# Patient Record
Sex: Female | Born: 1945 | Race: White | Hispanic: No | Smoking: Never smoker
Health system: Southern US, Community
[De-identification: ages and names within clinical notes are randomized; demographics above are authoritative.]

## PROBLEM LIST (undated history)

## (undated) DIAGNOSIS — F329 Major depressive disorder, single episode, unspecified: Secondary | ICD-10-CM

## (undated) DIAGNOSIS — E7800 Pure hypercholesterolemia, unspecified: Secondary | ICD-10-CM

## (undated) DIAGNOSIS — F32A Depression, unspecified: Secondary | ICD-10-CM

## (undated) DIAGNOSIS — F0390 Unspecified dementia without behavioral disturbance: Secondary | ICD-10-CM

## (undated) DIAGNOSIS — E079 Disorder of thyroid, unspecified: Secondary | ICD-10-CM

## (undated) HISTORY — PX: BREAST ENHANCEMENT SURGERY: SHX7

## (undated) HISTORY — PX: ABDOMINAL HYSTERECTOMY: SHX81

---

## 2013-09-21 ENCOUNTER — Emergency Department (HOSPITAL_COMMUNITY): Payer: Medicare Other

## 2013-09-21 ENCOUNTER — Emergency Department (HOSPITAL_COMMUNITY)
Admission: EM | Admit: 2013-09-21 | Discharge: 2013-09-21 | Disposition: A | Discharge status: Home / Self Care | Payer: Medicare Other | Attending: Emergency Medicine | Admitting: Emergency Medicine

## 2013-09-21 ENCOUNTER — Encounter (HOSPITAL_COMMUNITY): Payer: Self-pay | Admitting: Emergency Medicine

## 2013-09-21 DIAGNOSIS — S8002XA Contusion of left knee, initial encounter: Secondary | ICD-10-CM

## 2013-09-21 DIAGNOSIS — Y9289 Other specified places as the place of occurrence of the external cause: Secondary | ICD-10-CM | POA: Insufficient documentation

## 2013-09-21 DIAGNOSIS — Z79899 Other long term (current) drug therapy: Secondary | ICD-10-CM | POA: Insufficient documentation

## 2013-09-21 DIAGNOSIS — Y9389 Activity, other specified: Secondary | ICD-10-CM | POA: Insufficient documentation

## 2013-09-21 DIAGNOSIS — S8000XA Contusion of unspecified knee, initial encounter: Secondary | ICD-10-CM | POA: Insufficient documentation

## 2013-09-21 DIAGNOSIS — E669 Obesity, unspecified: Secondary | ICD-10-CM | POA: Insufficient documentation

## 2013-09-21 DIAGNOSIS — M7989 Other specified soft tissue disorders: Secondary | ICD-10-CM

## 2013-09-21 DIAGNOSIS — F3289 Other specified depressive episodes: Secondary | ICD-10-CM | POA: Insufficient documentation

## 2013-09-21 DIAGNOSIS — R296 Repeated falls: Secondary | ICD-10-CM | POA: Insufficient documentation

## 2013-09-21 DIAGNOSIS — E7800 Pure hypercholesterolemia, unspecified: Secondary | ICD-10-CM | POA: Insufficient documentation

## 2013-09-21 DIAGNOSIS — W19XXXA Unspecified fall, initial encounter: Secondary | ICD-10-CM

## 2013-09-21 DIAGNOSIS — F329 Major depressive disorder, single episode, unspecified: Secondary | ICD-10-CM | POA: Insufficient documentation

## 2013-09-21 DIAGNOSIS — E079 Disorder of thyroid, unspecified: Secondary | ICD-10-CM | POA: Insufficient documentation

## 2013-09-21 HISTORY — DX: Depression, unspecified: F32.A

## 2013-09-21 HISTORY — DX: Pure hypercholesterolemia, unspecified: E78.00

## 2013-09-21 HISTORY — DX: Major depressive disorder, single episode, unspecified: F32.9

## 2013-09-21 HISTORY — DX: Disorder of thyroid, unspecified: E07.9

## 2013-09-21 MED ORDER — NAPROXEN 500 MG PO TABS: 500.0000 mg | ORAL_TABLET | Freq: Two times a day (BID) | ORAL | Status: DC

## 2013-09-21 NOTE — ED Notes (Signed)
Pt brought into ED by [facility] Staff. [facility] was bringing pt's husband down to ED from [facility]. Pt was walking with a [facility] staff member when her leg 'gave' out. Pt states the same thing happened Sunday. Denies dizziness. Pt states she hasn't eaten yet today. MAE x4 freely.

## 2013-09-21 NOTE — Progress Notes (Signed)
*  Preliminary Results* Left lower extremity venous duplex completed. Left lower extremity is negative for deep vein thrombosis. There is evidence of a 5cm left Baker's cyst.  09/21/2013 3:29 PM  Gertie Fey, RVT, RDCS, RDMS

## 2013-09-21 NOTE — ED Notes (Signed)
Pt remains in vascular.

## 2013-09-21 NOTE — ED Provider Notes (Signed)
CSN: [id number]     Arrival date & time 09/21/13  1207 History   First MD Initiated Contact with Patient 09/21/13 1236     Chief Complaint  Patient presents with  . Fall   (Consider location/radiation/quality/duration/timing/severity/associated sxs/prior Treatment) HPI Comments: 67 year old female who has been undergoing long travels back and forth by 12 hour car ride frequently several times a month, presents after falling in the parking lot. States that her left became bruised when she fell from standing. She denies loss of consciousness, no prodromal symptoms, denies feeling like her leg gave out but cannot explain the fall in any other manner. There have been no other weakness, numbness, headache, blurred vision or any other complaints at all. This did happen the patient once Previously.  This seems to cluster around her travels, feels like her leg is tight and aches after traveling. No history of DVT though she does have family members with it.  Patient is a 67 y.o. female presenting with fall. The history is provided by the patient.  Fall    Past Medical History  Diagnosis Date  . Depression   . Hypercholesteremia   . Thyroid disease    Past Surgical History  Procedure Laterality Date  . Breast enhancement surgery    . Abdominal hysterectomy     No family history on file. History  Substance Use Topics  . Smoking status: Never Smoker   . Smokeless tobacco: Not on file  . Alcohol Use: Yes   OB History   Grav Para Term Preterm Abortions TAB SAB Ect Mult Living                 Review of Systems  All other systems reviewed and are negative.    Allergies  Codeine  Home Medications   Current Outpatient Rx  Name  Route  Sig  Dispense  Refill  . atorvastatin (LIPITOR) 20 MG tablet   Oral   Take 20 mg by mouth every other day.         . b complex vitamins tablet   Oral   Take 1 tablet by mouth daily.         . calcium-vitamin D (OSCAL WITH D) 500-200 MG-UNIT  per tablet   Oral   Take 1 tablet by mouth 2 (two) times daily.         . fish oil-omega-3 fatty acids 1000 MG capsule   Oral   Take 1 g by mouth 2 (two) times daily.         Marland Kitchen FLUoxetine (PROZAC) 20 MG capsule   Oral   Take 20 mg by mouth daily.         Marland Kitchen levothyroxine (SYNTHROID, LEVOTHROID) 112 MCG tablet   Oral   Take 112 mcg by mouth daily before breakfast.         . Melatonin 5 MG TABS   Oral   Take 5 mg by mouth at bedtime.         . Multiple Vitamin (MULTIVITAMIN WITH MINERALS) TABS tablet   Oral   Take 1 tablet by mouth daily.         . naproxen (NAPROSYN) 500 MG tablet   Oral   Take 1 tablet (500 mg total) by mouth 2 (two) times daily with a meal.   30 tablet   0    BP 104/88  Pulse 67  Temp(Src) 98.2 F (36.8 C)  Resp 15  Ht 5\' 4"  (1.626 m)  Wt  196 lb (88.905 kg)  BMI 33.63 kg/m2  SpO2 99% Physical Exam  Nursing note and vitals reviewed. Constitutional: She appears well-developed and well-nourished. No distress.  HENT:  Head: Normocephalic and atraumatic.  Mouth/Throat: Oropharynx is clear and moist. No oropharyngeal exudate.  Eyes: Conjunctivae and EOM are normal. Pupils are equal, round, and reactive to light. Right eye exhibits no discharge. Left eye exhibits no discharge. No scleral icterus.  Neck: Normal range of motion. Neck supple. No JVD present. No thyromegaly present.  Cardiovascular: Normal rate, regular rhythm, normal heart sounds and intact distal pulses.  Exam reveals no gallop and no friction rub.   No murmur heard. Pulmonary/Chest: Effort normal and breath sounds normal. No respiratory distress. She has no wheezes. She has no rales.  Abdominal: Soft. Bowel sounds are normal. She exhibits no distension and no mass. There is no tenderness.  Musculoskeletal: Normal range of motion. She exhibits tenderness ( Moderate tenderness and bruising to the left knee just distal to the patella, normal range of motion of the knee with  minimal difficulty). She exhibits no edema.  Lymphadenopathy:    She has no cervical adenopathy.  Neurological: She is alert. Coordination normal.  Skin: Skin is warm and dry. No rash noted. No erythema.  Psychiatric: She has a normal mood and affect. Her behavior is normal.    ED Course  Procedures (including critical care time) Labs Review Labs Reviewed - No data to display Imaging Review Dg Knee Complete 4 Views Left  09/21/2013   CLINICAL DATA:  Fall.  EXAM: LEFT KNEE - COMPLETE 4+ VIEW  COMPARISON:  None.  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 Small joint effusion. No acute bony abnormality. Specifically, no fracture, subluxation, or dislocation. Soft tissues are intact.  IMPRESSION: No acute bony abnormality.   Electronically Signed   By: Charlett Nose M.D.   On: 09/21/2013 13:38    EKG Interpretation     Ventricular Rate:  65 PR Interval:  132 QRS Duration: 96 QT Interval:  454 QTC Calculation: 472 R Axis:   -11 Text Interpretation:  Age not entered, assumed to be  67 years old for purpose of ECG interpretation Sinus rhythm Low voltage, precordial leads Borderline repolarization abnormality No old tracing to compare            MDM   1. Contusion of left knee, initial encounter   2. Fall, initial encounter    The patient is obese overweight legs, and is difficult to tell if there is asymmetry, she has been traveling and has left lower extremity symptoms with recurrent falls related to her left leg. Due to the bruising she wanted x-rays to rule out fracture, we'll also obtain an ultrasound to rule out DVT of the left lower extremity. She is otherwise hemodynamically stable and has a normal EKG.  [place] n eg for DVT, xray neg for frx, stable for d/c.   RICE therapy -   Meds given in ED:  Medications - No data to display  New Prescriptions   NAPROXEN  (NAPROSYN) 500 MG TABLET    Take 1 tablet (500 mg total) by mouth 2 (two) times daily with a meal.      Vida Roller, MD 09/21/13 1550

## 2019-04-21 ENCOUNTER — Ambulatory Visit (INDEPENDENT_AMBULATORY_CARE_PROVIDER_SITE_OTHER): Payer: Medicare Other | Admitting: Internal Medicine

## 2019-04-21 ENCOUNTER — Other Ambulatory Visit: Payer: Self-pay

## 2019-04-21 ENCOUNTER — Encounter: Payer: Self-pay | Admitting: Internal Medicine

## 2019-04-21 DIAGNOSIS — G4700 Insomnia, unspecified: Secondary | ICD-10-CM | POA: Diagnosis not present

## 2019-04-21 DIAGNOSIS — E039 Hypothyroidism, unspecified: Secondary | ICD-10-CM | POA: Diagnosis not present

## 2019-04-21 DIAGNOSIS — E785 Hyperlipidemia, unspecified: Secondary | ICD-10-CM | POA: Diagnosis not present

## 2019-04-21 DIAGNOSIS — F339 Major depressive disorder, recurrent, unspecified: Secondary | ICD-10-CM

## 2019-04-21 MED ORDER — ZOLPIDEM TARTRATE 10 MG PO TABS: 5.0000 mg | ORAL_TABLET | Freq: Every evening | ORAL | 2 refills | Status: DC | PRN

## 2019-04-21 MED ORDER — LORAZEPAM 2 MG PO TABS: 1.0000 mg | ORAL_TABLET | Freq: Every evening | ORAL | 2 refills | Status: DC | PRN

## 2019-04-21 NOTE — Patient Instructions (Signed)
-  Nice meeting you today!  -Schedule follow up with me in September for your wellness visit, please come in fasting.

## 2019-04-21 NOTE — Progress Notes (Signed)
New Patient Office Visit     CC/Reason for Visit: Establish care, follow-up on chronic medical conditions Previous PCP: In [place] Last Visit: September 2019  HPI: Brooke GullyBarbara Owens is a 73 y.o. female who is coming in today for the above mentioned reasons. Past Medical History is significant for: Hypothyroidism, hyperlipidemia, depression and insomnia.  In regards to her depression she has been on Prozac for years does well with this and feels like mood is stable.  She takes Lipitor for hyperlipidemia and Synthroid for hypothyroidism.  She was told her levels "were okay" when she had her wellness visit in September in [place].  She has significant issues with insomnia for which she takes Ambien and Ativan.  She takes a half a tablet of each but does not take them together, she alternates them every other night.  Sometimes despite that she also uses Benadryl for sleeping purposes.  She has been on this regimen for over 10 years.  She has not had any falls in the last 6 months, she does not have confusion or somnolence the next morning.  She is accompanied by her daughter came today who helps with providing history.  [place] in [place] and has come here to stay with her daughter now.   Past Medical/Surgical History: Past Medical History:  Diagnosis Date  . Depression   . Hypercholesteremia   . Thyroid disease     Past Surgical History:  Procedure Laterality Date  . ABDOMINAL HYSTERECTOMY    . BREAST ENHANCEMENT SURGERY      Social History:  reports that she has never smoked. She has never used smokeless tobacco. She reports current alcohol use. She reports that she does not use drugs.  Allergies: Allergies  Allergen Reactions  . Codeine     Family History:  No history of cancer, stroke that patient is aware of  Current Outpatient Medications:  .  atorvastatin (LIPITOR) 20 MG tablet, Take 20 mg by mouth every other day., Disp: , Rfl:  .   diphenhydrAMINE (BENADRYL) 25 MG tablet, Take 25 mg by mouth at bedtime as needed., Disp: , Rfl:  .  FLUoxetine (PROZAC) 20 MG capsule, Take 20 mg by mouth daily., Disp: , Rfl:  .  levothyroxine (SYNTHROID, LEVOTHROID) 112 MCG tablet, Take 112 mcg by mouth daily before breakfast., Disp: , Rfl:  .  LORazepam (ATIVAN) 2 MG tablet, Take 0.5 tablets (1 mg total) by mouth at bedtime as needed for sleep., Disp: 20 tablet, Rfl: 2 .  Multiple Vitamin (MULTIVITAMIN WITH MINERALS) TABS tablet, Take 1 tablet by mouth daily., Disp: , Rfl:  .  naproxen (NAPROSYN) 500 MG tablet, Take 1 tablet (500 mg total) by mouth 2 (two) times daily with a meal., Disp: 30 tablet, Rfl: 0 .  zolpidem (AMBIEN) 10 MG tablet, Take 0.5 tablets (5 mg total) by mouth at bedtime as needed for sleep., Disp: 20 tablet, Rfl: 2  Review of Systems:  Constitutional: Denies fever, chills, diaphoresis, appetite change and fatigue.  HEENT: Denies photophobia, eye pain, redness, hearing loss, ear pain, congestion, sore throat, rhinorrhea, sneezing, mouth sores, trouble swallowing, neck pain, neck stiffness and tinnitus.   Respiratory: Denies SOB, DOE, cough, chest tightness,  and wheezing.   Cardiovascular: Denies chest pain, palpitations and leg swelling.  Gastrointestinal: Denies nausea, vomiting, abdominal pain, diarrhea, constipation, blood in stool and abdominal distention.  Genitourinary: Denies dysuria, urgency, frequency, hematuria, flank pain and difficulty urinating.  Endocrine: Denies: hot or cold  intolerance, sweats, changes in hair or nails, polyuria, polydipsia. Musculoskeletal: Denies myalgias, back pain, joint swelling, arthralgias and gait problem.  Skin: Denies pallor, rash and wound.  Neurological: Denies dizziness, seizures, syncope, weakness, light-headedness, numbness and headaches.  Hematological: Denies adenopathy. Easy bruising, personal or family bleeding history  Psychiatric/Behavioral: Denies suicidal ideation,  mood changes, confusion, nervousness, sleep disturbance and agitation    Physical Exam: Vitals:   04/21/19 1308  BP: 140/80  Pulse: 68  Temp: 98.1 F (36.7 C)  TempSrc: Oral  SpO2: 96%  Weight: 171 lb 6.4 oz (77.7 kg)  Height: 5\' 4"  (1.626 m)   Body mass index is 29.42 kg/m.   Constitutional: NAD, calm, comfortable Eyes: PERRL, lids and conjunctivae normal ENMT: Mucous membranes are moist.  Respiratory: clear to auscultation bilaterally, no wheezing, no crackles. Normal respiratory effort. No accessory muscle use.  Cardiovascular: Regular rate and rhythm, no murmurs / rubs / gallops. No extremity edema. 2+ pedal pulses. No carotid bruits.  Abdomen: no tenderness, no masses palpated. No hepatosplenomegaly. Bowel sounds positive.  Musculoskeletal: no clubbing / cyanosis. No joint deformity upper and lower extremities. Good ROM, no contractures. Normal muscle tone.  Psychiatric: Normal judgment and insight. Alert and oriented x 3. Normal mood.    Impression and Plan:  Hypothyroidism, unspecified type -Continue Synthroid, check TSH during physical in September.  Hyperlipidemia, unspecified hyperlipidemia type -Continue Lipitor, check lipids at time of wellness visit.  Insomnia, unspecified type  -Since she has been on Ativan and Ambien long-term, have agreed to continue these prescriptions with the understanding that patient's daughter is to manage her medications and that she is never to take Ativan and Ambien on the same night.  Depression, recurrent (HCC) -Mood is stable, continue Prozac.     Patient Instructions  -Nice meeting you today!  -Schedule follow up with me in September for your wellness visit, please come in fasting.     Chaya Jan, MD  Primary Care at [facility]

## 2019-07-14 ENCOUNTER — Other Ambulatory Visit: Payer: Self-pay | Admitting: Internal Medicine

## 2019-07-14 MED ORDER — ATORVASTATIN CALCIUM 20 MG PO TABS: 20.0000 mg | ORAL_TABLET | ORAL | 0 refills | Status: AC

## 2019-07-14 NOTE — Telephone Encounter (Signed)
Patient last seen 04/21/2019. Medication was last filled 09/21/2013 by historical provider.  Requested Prescriptions  Pending Prescriptions Disp Refills  . atorvastatin (LIPITOR) 20 MG tablet      Sig: Take 1 tablet (20 mg total) by mouth every other day.     Cardiovascular:  Antilipid - Statins Failed - 07/14/2019 10:24 AM      Failed - Total Cholesterol in normal range and within 360 days    No results found for: CHOL, POCCHOL       Failed - LDL in normal range and within 360 days    No results found for: LDLCALC, LDLC, HIRISKLDL       Failed - HDL in normal range and within 360 days    No results found for: HDL       Failed - Triglycerides in normal range and within 360 days    No results found for: TRIG       Passed - Patient is not pregnant      Passed - Valid encounter within last 12 months    Recent Outpatient Visits          2 months ago Hypothyroidism, unspecified type   Therapist, music at [employer], Rayford Halsted, MD

## 2019-07-14 NOTE — Telephone Encounter (Signed)
Medication Refill - Medication: atorvastatin (LIPITOR) 20 MG tablet    Has the patient contacted their pharmacy? Yes.   Pts daughter called stating pt is out of medication. Please advise.  (Agent: If no, request that the patient contact the pharmacy for the refill.) (Agent: If yes, when and what did the pharmacy advise?)  Preferred Pharmacy (with phone number or street name):  [facility] [address], [place]  [place] [address]  Phone: [phone] Fax: [phone]  Not a 24 hour pharmacy; exact hours not known.    Agent: Please be advised that RX refills may take up to 3 business days. We ask that you follow-up with your pharmacy.

## 2019-08-30 ENCOUNTER — Other Ambulatory Visit: Payer: Self-pay

## 2019-08-30 ENCOUNTER — Telehealth (INDEPENDENT_AMBULATORY_CARE_PROVIDER_SITE_OTHER): Payer: Medicare Other | Admitting: Neurology

## 2019-08-30 ENCOUNTER — Encounter: Payer: Self-pay | Admitting: Neurology

## 2019-08-30 VITALS — Ht 64.0 in | Wt 176.0 lb

## 2019-08-30 DIAGNOSIS — F03A0 Unspecified dementia, mild, without behavioral disturbance, psychotic disturbance, mood disturbance, and anxiety: Secondary | ICD-10-CM

## 2019-08-30 DIAGNOSIS — F0390 Unspecified dementia without behavioral disturbance: Secondary | ICD-10-CM

## 2019-08-30 MED ORDER — DONEPEZIL HCL 10 MG PO TABS: ORAL_TABLET | ORAL | 11 refills | Status: DC

## 2019-08-30 NOTE — Progress Notes (Signed)
Virtual Visit via Video Note The purpose of this virtual visit is to provide medical care while limiting exposure to the novel coronavirus.    Consent was obtained for video visit:  Yes.   Answered questions that patient had about telehealth interaction:  Yes.   I discussed the limitations, risks, security and privacy concerns of performing an evaluation and management service by telemedicine. I also discussed with the patient that there may be a patient responsible charge related to this service. The patient expressed understanding and agreed to proceed.  Pt location: Home Physician Location: office Name of referring provider:  Glenis Smoker, * I connected with Radene Ou at patients initiation/request on 08/30/2019 at  1:00 PM EDT by video enabled telemedicine application and verified that I am speaking with the correct person using two identifiers. Pt MRN:  [id number] Pt DOB:  1946/09/21 Video Participants:  Radene Ou;  Dortha Kern (daughter)   History of Present Illness:  The patient was seen as a virtual video visit on 08/30/2019. She is a 73 year old right-handed woman with a history of hyperlipidemia, hypothyroidism, depression, presenting for evaluation of memory loss. She feels her memory is going pretty fast lately. She had been living in [place] and moved to [facility] last January. Her daughter reports that when the patient's mother passed away in 08/22/18, she noticed some memory changes but attributed it to grieving. She stayed with Kim's family in November and noticed that she was more forgetful, asking the same questions. Kim asked her to move to [facility], she lives alone very close to [employer]. When she was living in [place], she was managing her finances without issues. Maudie Mercury reports that when her husband passed away in 03-22-2016, majority of bills were on autodraft. Maudie Mercury states she has no concept of bills and bank accounts and does not think she could do it, there were 6 different  bank accounts which she did close before moving, and when she moved she had written checks from accounts she had already closed. She was not balancing her checkbook. She states she got lost driving one day due to road work, she has not been driving in [place]. She denies misplacing things frequently or leaving the stove on. She denies any word-finding difficulties. Mood is fine. Maudie Mercury has not noticed any personality changes, no paranoia or hallucinations. In March, she had been taking Ambien, Ativan, and Benadryl, they had stopped Ativan in July and Benadryl a couple of weeks ago. She manages her own medications and occasionally forgets it, but for the most part does well. Maudie Mercury is not sure if there has been any improvement in cognition off the Benadryl and Ativan. Sleep is fine, she wakes up but goes back to sleep. Her father and paternal grandmother had Alzheimer's disease. No history of significant head injuries. She used to drink alcohol but stopped many years ago.   She denies any headaches, dizziness, diplopia, dysarthria, dysphagia, neck/back pain, focal numbness/tingling/weakness, bladder dysfunction. No tremors, no falls. She has occasional constipation. She is a retired Theme park manager and had lost her sense of smell at age 28.   Laboratory Data: TSH 08/16/2019: 0.06   PAST MEDICAL HISTORY: Past Medical History:  Diagnosis Date   Depression    Hypercholesteremia    Thyroid disease     PAST SURGICAL HISTORY: Past Surgical History:  Procedure Laterality Date   ABDOMINAL HYSTERECTOMY     BREAST ENHANCEMENT SURGERY      MEDICATIONS: Current Outpatient Medications on File Prior  to Visit  Medication Sig Dispense Refill   atorvastatin (LIPITOR) 20 MG tablet Take 1 tablet (20 mg total) by mouth every other day. (Patient taking differently: Take 20 mg by mouth daily. ) 90 tablet 0   FLUoxetine (PROZAC) 20 MG capsule Take 20 mg by mouth daily.     levothyroxine (SYNTHROID) 100 MCG tablet Take  100 mcg by mouth daily before breakfast.     LORazepam (ATIVAN) 2 MG tablet Take 0.5 tablets (1 mg total) by mouth at bedtime as needed for sleep. 20 tablet 2   Multiple Vitamin (MULTIVITAMIN WITH MINERALS) TABS tablet Take 1 tablet by mouth daily.     naproxen (NAPROSYN) 500 MG tablet Take 1 tablet (500 mg total) by mouth 2 (two) times daily with a meal. (Patient taking differently: Take 500 mg by mouth as needed. ) 30 tablet 0   zolpidem (AMBIEN) 10 MG tablet Take 0.5 tablets (5 mg total) by mouth at bedtime as needed for sleep. 20 tablet 2   No current facility-administered medications on file prior to visit.     ALLERGIES: Allergies  Allergen Reactions   Codeine     FAMILY HISTORY: History reviewed. Father and paternal grandmother had Alzheimer's disease.  Observations/Objective:   Vitals:   08/30/19 1048  Weight: 176 lb (79.8 kg)  Height: 5\' 4"  (1.626 m)   GEN:  The patient appears stated age and is in NAD.  Neurological examination: Patient is awake, alert, oriented x 3. No aphasia or dysarthria. Intact fluency and comprehension. Remote and recent memory impaired. Able to name and repeat.  Montreal Cognitive Assessment  08/30/2019  Visuospatial/ Executive (0/5) 2  Naming (0/3) 2  Attention: Read list of digits (0/2) 2  Attention: Read list of letters (0/1) 1  Attention: Serial 7 subtraction starting at 100 (0/3) 0  Language: Repeat phrase (0/2) 2  Language : Fluency (0/1) 1  Abstraction (0/2) 0  Delayed Recall (0/5) 0  Orientation (0/6) 4  Total 14  Adjusted Score (based on education) 15   Cranial nerves: Extraocular movements intact with no nystagmus. No facial asymmetry. Motor: moves all extremities symmetrically, at least anti-gravity x 4. No incoordination on finger to nose testing. Gait: narrow-based and steady, able to tandem walk adequately. Negative Romberg test.  Assessment and Plan:   This is a  73 year old right-handed woman with a history of  hyperlipidemia, hypothyroidism, depression, presenting for evaluation of memory loss. Her neurological exam (although limited on video) is non-focal, MOCA score 15/30. She is also having slightly more difficulties with complex tasks. 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 MRI brain without contrast will be ordered to assess for underlying structural abnormality and assess vascular load. Check B12 level, her TSH was abnormal, medication has been adjusted. We discussed starting Donepezil, including side effects and expectations, start 10mg  1/2 tablet daily for 2 weeks, then increase to 1 tablet daily. Continue close supervision. She has not been driving. At this point with current evaluation, decision-making capacity is intact,she will be appointing her daughter as POA. Follow-up in 6 months, they know to call for any changes.    Follow Up Instructions:   -I discussed the assessment and treatment plan with the patient/daughter. The patient/daughter were provided an opportunity to ask questions and all were answered. The patient/daughter agreed with the plan and demonstrated an understanding of the instructions.   The patient/daughter were advised to call back or seek an in-person evaluation if the  symptoms worsen or if the condition fails to improve as anticipated.    Van ClinesKaren M Sokha Craker, MD

## 2019-09-26 ENCOUNTER — Other Ambulatory Visit: Payer: Self-pay

## 2019-09-26 ENCOUNTER — Ambulatory Visit
Admission: RE | Admit: 2019-09-26 | Discharge: 2019-09-26 | Disposition: A | Discharge status: Home / Self Care | Payer: Medicare Other | Source: Ambulatory Visit | Attending: Neurology | Admitting: Neurology

## 2019-09-26 DIAGNOSIS — F03A0 Unspecified dementia, mild, without behavioral disturbance, psychotic disturbance, mood disturbance, and anxiety: Secondary | ICD-10-CM

## 2019-09-26 DIAGNOSIS — F0390 Unspecified dementia without behavioral disturbance: Secondary | ICD-10-CM

## 2019-09-29 ENCOUNTER — Telehealth: Payer: Self-pay

## 2019-09-29 NOTE — Telephone Encounter (Signed)
-----   Message from Cameron Sprang, MD sent at 09/28/2019 12:15 PM EST ----- Pls let patient/daughter know I reviewed MRI brain, no evidence of tumor, stroke, or bleed. It shows age-related changes. Thanks

## 2019-09-29 NOTE — Telephone Encounter (Signed)
Pt informed of MRI results. No concerns at this time. 

## 2019-10-05 ENCOUNTER — Telehealth: Payer: Self-pay | Admitting: Neurology

## 2019-10-05 NOTE — Telephone Encounter (Signed)
Patient's daughter, Geanie Kenning (no DPR on file to share PHI), called and left a message with Access Nurse today at 12:25 PM.   Caller states she is trying to get a letter saying that her mother has dementia but also wondering how she can get her competency tested.

## 2019-10-05 NOTE — Telephone Encounter (Signed)
Dr. Delice Lesch,  Would you please type a letter for this pts daughter. It will be to appoint her as POA. Daughter is Brooke Owens. Letter can be mailed to pts home address.

## 2019-10-10 ENCOUNTER — Encounter: Payer: Self-pay | Admitting: Neurology

## 2019-10-10 NOTE — Telephone Encounter (Signed)
Please let daughter know note is ready. When we did the visit last month, her mother did not appear to have any competency issues, however if she would like a formal competency evaluation, I have been sending my patients to Dr. Dillard Essex for this, she can contact Dr. Marylynn Pearson office number [phone] for Assessment of Decision-making capacity. Thanks

## 2019-10-10 NOTE — Telephone Encounter (Signed)
Letter addressed to daughter, Dortha Kern. Address [address]                  [place] [address] Mailed today.

## 2020-01-11 ENCOUNTER — Telehealth: Payer: Self-pay | Admitting: Neurology

## 2020-01-11 NOTE — Telephone Encounter (Signed)
Ok to stop donepezil, no need to wean. Thanks

## 2020-01-11 NOTE — Telephone Encounter (Signed)
Patient called regarding her mom Brooke Owens and her Donepezil 10 mg medication. The patient is wanting to stop taking her medication due to it upsetting her stomach and she feels that it is not helping her. Her daughter [name] would like to know if she should wean off or can she just stop the medication? Please Call. Thank you

## 2020-01-11 NOTE — Telephone Encounter (Signed)
Pt daughter called informed Ok to stop donepezil, no need to wean

## 2020-01-27 ENCOUNTER — Other Ambulatory Visit: Payer: Self-pay | Admitting: Family Medicine

## 2020-01-27 DIAGNOSIS — Z1231 Encounter for screening mammogram for malignant neoplasm of breast: Secondary | ICD-10-CM

## 2020-01-27 DIAGNOSIS — E2839 Other primary ovarian failure: Secondary | ICD-10-CM

## 2020-04-09 ENCOUNTER — Ambulatory Visit: Payer: Medicare Other | Admitting: Neurology

## 2020-05-02 ENCOUNTER — Other Ambulatory Visit: Payer: Medicare Other

## 2020-05-02 ENCOUNTER — Ambulatory Visit: Payer: Medicare Other

## 2020-06-20 ENCOUNTER — Ambulatory Visit
Admission: RE | Admit: 2020-06-20 | Discharge: 2020-06-20 | Disposition: A | Discharge status: Home / Self Care | Payer: Medicare Other | Source: Ambulatory Visit | Attending: Family Medicine | Admitting: Family Medicine

## 2020-06-20 DIAGNOSIS — E2839 Other primary ovarian failure: Secondary | ICD-10-CM

## 2022-02-14 ENCOUNTER — Other Ambulatory Visit: Payer: Self-pay

## 2022-02-14 ENCOUNTER — Emergency Department (HOSPITAL_COMMUNITY): Payer: Medicare Other

## 2022-02-14 ENCOUNTER — Encounter (HOSPITAL_COMMUNITY): Payer: Self-pay

## 2022-02-14 ENCOUNTER — Emergency Department (HOSPITAL_COMMUNITY)
Admission: EM | Admit: 2022-02-14 | Discharge: 2022-02-14 | Disposition: A | Discharge status: Home / Self Care | Payer: Medicare Other | Attending: Emergency Medicine | Admitting: Emergency Medicine

## 2022-02-14 DIAGNOSIS — S60222A Contusion of left hand, initial encounter: Secondary | ICD-10-CM | POA: Diagnosis not present

## 2022-02-14 DIAGNOSIS — F0390 Unspecified dementia without behavioral disturbance: Secondary | ICD-10-CM | POA: Insufficient documentation

## 2022-02-14 DIAGNOSIS — S60512A Abrasion of left hand, initial encounter: Secondary | ICD-10-CM | POA: Diagnosis not present

## 2022-02-14 DIAGNOSIS — S0993XA Unspecified injury of face, initial encounter: Secondary | ICD-10-CM | POA: Diagnosis present

## 2022-02-14 DIAGNOSIS — S00411A Abrasion of right ear, initial encounter: Secondary | ICD-10-CM | POA: Diagnosis not present

## 2022-02-14 DIAGNOSIS — W01198A Fall on same level from slipping, tripping and stumbling with subsequent striking against other object, initial encounter: Secondary | ICD-10-CM | POA: Insufficient documentation

## 2022-02-14 DIAGNOSIS — Z79899 Other long term (current) drug therapy: Secondary | ICD-10-CM | POA: Insufficient documentation

## 2022-02-14 DIAGNOSIS — W06XXXA Fall from bed, initial encounter: Secondary | ICD-10-CM

## 2022-02-14 DIAGNOSIS — S0083XA Contusion of other part of head, initial encounter: Secondary | ICD-10-CM | POA: Diagnosis not present

## 2022-02-14 HISTORY — DX: Unspecified dementia, unspecified severity, without behavioral disturbance, psychotic disturbance, mood disturbance, and anxiety: F03.90

## 2022-02-14 NOTE — ED Triage Notes (Signed)
Patient BIB [facility] from [facility] memory care unit. Had a fall out of bed 30 minutes ago. Staff member saw her roll out of bed and hit her head on night stand. Knot on the right side of her cheek. Laceration to her right ear, left hand. Dementia. No blood thinners no LOC.  ?

## 2022-02-14 NOTE — ED Provider Notes (Signed)
? ?[facility]-EMERGENCY DEPT ?Provider Note: Lowella Dell, MD, FACEP ? ?CSN: [id number] ?MRN: [id number] ?ARRIVAL: 02/14/22 at 0038 ?ROOM: [facility]15/[facility]15 ? ? ?CHIEF COMPLAINT  ?Fall ? ?Level 5 caveat: Dementia ?HISTORY OF PRESENT ILLNESS  ?02/14/22 1:19 AM ?Brooke Owens is a 76 y.o. female who fell out of bed about 30 minutes prior to arrival.  This was a witnessed fall.  She rolled out and hit her head on the nightstand.  She has a hematoma to her right cheek.  She has abrasions to her right ear and left hand.  There was no loss of consciousness.  She is not on anticoagulation.  She is complaining of pain to her right cheek and states it feels tight. ? ? ?Past Medical History:  ?Diagnosis Date  ? Dementia (HCC)   ? Depression   ? Hypercholesteremia   ? Thyroid disease   ? ? ?Past Surgical History:  ?Procedure Laterality Date  ? ABDOMINAL HYSTERECTOMY    ? BREAST ENHANCEMENT SURGERY    ? ? ?History reviewed. No pertinent family history. ? ?Social History  ? ?Tobacco Use  ? Smoking status: Never  ? Smokeless tobacco: Never  ?Vaping Use  ? Vaping Use: Never used  ?Substance Use Topics  ? Alcohol use: Yes  ? Drug use: No  ? ? ?Prior to Admission medications   ?Medication Sig Start Date End Date Taking? Authorizing Provider  ?atorvastatin (LIPITOR) 20 MG tablet Take 1 tablet (20 mg total) by mouth every other day. ?Patient taking differently: Take 20 mg by mouth daily.  07/14/19   Philip Aspen, Limmie Patricia, MD  ?donepezil (ARICEPT) 10 MG tablet Take 1/2 tablet daily for 2 weeks, then increase to 1 tablet daily 08/30/19   Van Clines, MD  ?FLUoxetine (PROZAC) 20 MG capsule Take 20 mg by mouth daily.    [provider]  ?levothyroxine (SYNTHROID) 100 MCG tablet Take 100 mcg by mouth daily before breakfast.    [provider]  ?LORazepam (ATIVAN) 2 MG tablet Take 0.5 tablets (1 mg total) by mouth at bedtime as needed for sleep. 04/21/19   Philip Aspen, Limmie Patricia, MD  ?Multiple Vitamin (MULTIVITAMIN WITH  MINERALS) TABS tablet Take 1 tablet by mouth daily.    [provider]  ?naproxen (NAPROSYN) 500 MG tablet Take 1 tablet (500 mg total) by mouth 2 (two) times daily with a meal. ?Patient taking differently: Take 500 mg by mouth as needed.  09/21/13   Eber Hong, MD  ?zolpidem (AMBIEN) 10 MG tablet Take 0.5 tablets (5 mg total) by mouth at bedtime as needed for sleep. 04/21/19   Philip Aspen, Limmie Patricia, MD  ? ? ?Allergies ?Codeine ? ? ?REVIEW OF SYSTEMS  ?Level 5 caveat ? ? ?PHYSICAL EXAMINATION  ?Initial Vital Signs ?Blood pressure 115/61, pulse (!) 55, temperature 97.8 ?F (36.6 ?C), temperature source Oral, resp. rate 17, height 5\' 4"  (1.626 m), weight 80 kg, SpO2 97 %. ? ?Examination ?General: Well-developed, well-nourished female in no acute distress; appearance consistent with age of record ?HENT: normocephalic; hematoma of right cheek; abrasion of right ear: ? ? ? ?Eyes: pupils equal, round and reactive to light; extraocular muscles grossly intact ?Neck: supple; nontender ?Heart: regular rate and rhythm ?Lungs: clear to auscultation bilaterally ?Abdomen: soft; nondistended; nontender; bowel sounds present ?Extremities: No deformity; full range of motion; pulses normal ?Neurologic: Awake, alert; motor function intact in all extremities and symmetric; no facial droop ?Skin: Warm and dry; abrasion and contusion of left hand: ? ? ? ?  Psychiatric: Flat affect ? ? ?RESULTS  ?Summary of this visit's results, reviewed and interpreted by myself: ? ? EKG Interpretation ? ?Date/Time:    ?Ventricular Rate:    ?PR Interval:    ?QRS Duration:   ?QT Interval:    ?QTC Calculation:   ?R Axis:     ?Text Interpretation:   ?  ? ?  ? ?Laboratory Studies: ?No results found for this or any previous visit (from the past 24 hour(s)). ?Imaging Studies: ?DG Hand Complete Left ? ?Result Date: 02/14/2022 ?CLINICAL DATA:  Fall, left hand injury EXAM: LEFT HAND - COMPLETE 3+ VIEW COMPARISON:  None. FINDINGS: Distal radial ORIF  has been performed normal alignment. No acute fracture or dislocation. Mild degenerative changes noted at the first metacarpophalangeal joint of the thumb and third PIP joint. Remaining joint spaces are preserved. Soft tissues are unremarkable. IMPRESSION: No acute abnormality. Electronically Signed   By: Helyn Numbers M.D.   On: 02/14/2022 01:49  ? ?CT Maxillofacial Wo Contrast ? ?Result Date: 02/14/2022 ?CLINICAL DATA:  Fall, blunt facial trauma EXAM: CT MAXILLOFACIAL WITHOUT CONTRAST TECHNIQUE: Multidetector CT imaging of the maxillofacial structures was performed. Multiplanar CT image reconstructions were also generated. RADIATION DOSE REDUCTION: This exam was performed according to the departmental dose-optimization program which includes automated exposure control, adjustment of the mA and/or kV according to patient size and/or use of iterative reconstruction technique. COMPARISON:  None. FINDINGS: Osseous: No fracture or mandibular dislocation. No destructive process. Orbits: Negative. No traumatic or inflammatory finding. Sinuses: Clear. Soft tissues: Mild right preseptal soft tissue swelling moderate subcutaneous soft tissue infiltration in keeping with edema and/or hemorrhage within the right buccal region. Limited intracranial: No significant or unexpected finding. IMPRESSION: Right facial and preseptal soft tissue swelling. No acute facial fracture or mandibular dislocation. Electronically Signed   By: Helyn Numbers M.D.   On: 02/14/2022 02:06   ? ?ED COURSE and MDM  ?Nursing notes, initial and subsequent vitals signs, including pulse oximetry, reviewed and interpreted by myself. ? ?Vitals:  ? 02/14/22 0046 02/14/22 0048  ?BP:  115/61  ?Pulse:  (!) 55  ?Resp:  17  ?Temp:  97.8 ?F (36.6 ?C)  ?TempSrc:  Oral  ?SpO2:  97%  ?Weight: 80 kg   ?Height: 5\' 4"  (1.626 m)   ? ?Medications - No data to display ? ?No evidence of facial or hand fracture on CT.  Abrasions are superficial and will be dressed by nursing  staff.  Patient awake and alert to what nursing home staff states is her baseline. ? ?PROCEDURES  ?Procedures ? ? ?ED DIAGNOSES  ? ?  ICD-10-CM   ?1. Fall from bed, initial encounter  W06.10-26-1993   ?  ?2. Traumatic hematoma of cheek, initial encounter  S00.83XA   ?  ?3. Abrasion of right ear, initial encounter  S00.411A   ?  ?4. Abrasion of left hand, initial encounter  S60.512A   ?  ?5. Contusion of left hand, initial encounter  S60.222A   ?  ? ? ? ?  ?03-16-1977, MD ?02/14/22 02/16/22 ? ?

## 2022-05-30 ENCOUNTER — Encounter (HOSPITAL_COMMUNITY): Payer: Self-pay

## 2022-05-30 ENCOUNTER — Emergency Department (HOSPITAL_COMMUNITY)
Admission: EM | Admit: 2022-05-30 | Discharge: 2022-05-31 | Disposition: A | Discharge status: Home / Self Care | Payer: Medicare Other | Attending: Emergency Medicine | Admitting: Emergency Medicine

## 2022-05-30 ENCOUNTER — Other Ambulatory Visit: Payer: Self-pay

## 2022-05-30 DIAGNOSIS — F0390 Unspecified dementia without behavioral disturbance: Secondary | ICD-10-CM | POA: Diagnosis not present

## 2022-05-30 DIAGNOSIS — E876 Hypokalemia: Secondary | ICD-10-CM | POA: Insufficient documentation

## 2022-05-30 DIAGNOSIS — E039 Hypothyroidism, unspecified: Secondary | ICD-10-CM | POA: Diagnosis not present

## 2022-05-30 LAB — MAGNESIUM: Magnesium: 2.2 mg/dL (ref 1.7–2.4)

## 2022-05-30 LAB — COMPREHENSIVE METABOLIC PANEL
ALT: 15 U/L (ref 0–44)
AST: 24 U/L (ref 15–41)
Albumin: 3.8 g/dL (ref 3.5–5.0)
Alkaline Phosphatase: 90 U/L (ref 38–126)
Anion gap: 11 (ref 5–15)
BUN: 21 mg/dL (ref 8–23)
CO2: 35 mmol/L — ABNORMAL HIGH (ref 22–32)
Calcium: 9 mg/dL (ref 8.9–10.3)
Chloride: 92 mmol/L — ABNORMAL LOW (ref 98–111)
Creatinine, Ser: 1.1 mg/dL — ABNORMAL HIGH (ref 0.44–1.00)
GFR, Estimated: 52 mL/min — ABNORMAL LOW (ref >60)
Glucose, Bld: 104 mg/dL — ABNORMAL HIGH (ref 70–99)
Potassium: 2.5 mmol/L — CL (ref 3.5–5.1)
Sodium: 138 mmol/L (ref 135–145)
Total Bilirubin: 1.2 mg/dL (ref 0.3–1.2)
Total Protein: 7.4 g/dL (ref 6.5–8.1)

## 2022-05-30 LAB — CBC WITH DIFFERENTIAL/PLATELET
Abs Immature Granulocytes: 0.01 10*3/uL (ref 0.00–0.07)
Basophils Absolute: 0 10*3/uL (ref 0.0–0.1)
Basophils Relative: 1 %
Eosinophils Absolute: 0.1 10*3/uL (ref 0.0–0.5)
Eosinophils Relative: 1 %
HCT: 41.4 % (ref 36.0–46.0)
Hemoglobin: 13.8 g/dL (ref 12.0–15.0)
Immature Granulocytes: 0 %
Lymphocytes Relative: 35 %
Lymphs Abs: 2.4 10*3/uL (ref 0.7–4.0)
MCH: 29.5 pg (ref 26.0–34.0)
MCHC: 33.3 g/dL (ref 30.0–36.0)
MCV: 88.5 fL (ref 80.0–100.0)
Monocytes Absolute: 0.7 10*3/uL (ref 0.1–1.0)
Monocytes Relative: 11 %
Neutro Abs: 3.6 10*3/uL (ref 1.7–7.7)
Neutrophils Relative %: 52 %
Platelets: 242 10*3/uL (ref 150–400)
RBC: 4.68 MIL/uL (ref 3.87–5.11)
RDW: 13.3 % (ref 11.5–15.5)
WBC: 6.8 10*3/uL (ref 4.0–10.5)
nRBC: 0 % (ref 0.0–0.2)

## 2022-05-30 LAB — BASIC METABOLIC PANEL
Anion gap: 9 (ref 5–15)
BUN: 17 mg/dL (ref 8–23)
CO2: 32 mmol/L (ref 22–32)
Calcium: 8.4 mg/dL — ABNORMAL LOW (ref 8.9–10.3)
Chloride: 101 mmol/L (ref 98–111)
Creatinine, Ser: 0.78 mg/dL (ref 0.44–1.00)
GFR, Estimated: >60 mL/min (ref >60)
Glucose, Bld: 88 mg/dL (ref 70–99)
Potassium: 2.9 mmol/L — ABNORMAL LOW (ref 3.5–5.1)
Sodium: 142 mmol/L (ref 135–145)

## 2022-05-30 LAB — URINALYSIS, ROUTINE W REFLEX MICROSCOPIC
Bilirubin Urine: NEGATIVE
Glucose, UA: NEGATIVE mg/dL
Hgb urine dipstick: NEGATIVE
Ketones, ur: NEGATIVE mg/dL
Leukocytes,Ua: NEGATIVE
Nitrite: NEGATIVE
Protein, ur: NEGATIVE mg/dL
Specific Gravity, Urine: 1.008 (ref 1.005–1.030)
pH: 7 (ref 5.0–8.0)

## 2022-05-30 MED ORDER — POTASSIUM CHLORIDE CRYS ER 20 MEQ PO TBCR
40.0000 meq | EXTENDED_RELEASE_TABLET | Freq: Once | ORAL | Status: AC
  Administered 2022-05-30: 40 meq via ORAL
  Filled 2022-05-30: qty 2

## 2022-05-30 MED ORDER — POTASSIUM CHLORIDE 10 MEQ/100ML IV SOLN
10.0000 meq | INTRAVENOUS | Status: AC
  Administered 2022-05-30 (×5): 10 meq via INTRAVENOUS
  Filled 2022-05-30 (×5): qty 100

## 2022-05-30 NOTE — ED Provider Notes (Signed)
[facility]-EMERGENCY DEPT Provider Note   CSN: [id number] Arrival date & time: 05/30/22  1447     History {Add pertinent medical, surgical, social history, OB history to HPI:1} Chief Complaint  Patient presents with   Abnormal Lab    Brooke Owens is a 76 y.o. female.  HPI    history of hyperlipidemia, hypothyroidism, depression, presenting for evaluation of memory loss.      Home Medications Prior to Admission medications   Medication Sig Start Date End Date Taking? Authorizing Provider  atorvastatin (LIPITOR) 20 MG tablet Take 1 tablet (20 mg total) by mouth every other day. Patient taking differently: Take 20 mg by mouth daily.  07/14/19   Philip Aspen, Limmie Patricia, MD  donepezil (ARICEPT) 10 MG tablet Take 1/2 tablet daily for 2 weeks, then increase to 1 tablet daily 08/30/19   Van Clines, MD  FLUoxetine (PROZAC) 20 MG capsule Take 20 mg by mouth daily.    [provider]  levothyroxine (SYNTHROID) 100 MCG tablet Take 100 mcg by mouth daily before breakfast.    [provider]  LORazepam (ATIVAN) 2 MG tablet Take 0.5 tablets (1 mg total) by mouth at bedtime as needed for sleep. 04/21/19   Philip Aspen, Limmie Patricia, MD  Multiple Vitamin (MULTIVITAMIN WITH MINERALS) TABS tablet Take 1 tablet by mouth daily.    [provider]  naproxen (NAPROSYN) 500 MG tablet Take 1 tablet (500 mg total) by mouth 2 (two) times daily with a meal. Patient taking differently: Take 500 mg by mouth as needed.  09/21/13   Eber Hong, MD  zolpidem (AMBIEN) 10 MG tablet Take 0.5 tablets (5 mg total) by mouth at bedtime as needed for sleep. 04/21/19   Philip Aspen, Limmie Patricia, MD      Allergies    Codeine    Review of Systems   Review of Systems  Physical Exam Updated Vital Signs BP 111/66   Pulse 64   Temp 98.6 F (37 C) (Oral)   Resp (!) 21   SpO2 97%  Physical Exam  ED Results / Procedures / Treatments   Labs (all labs  ordered are listed, but only abnormal results are displayed) Labs Reviewed  COMPREHENSIVE METABOLIC PANEL - Abnormal; Notable for the following components:      Result Value   Potassium 2.5 (*)    Chloride 92 (*)    CO2 35 (*)    Glucose, Bld 104 (*)    Creatinine, Ser 1.10 (*)    GFR, Estimated 52 (*)    All other components within normal limits  CBC WITH DIFFERENTIAL/PLATELET  MAGNESIUM  URINALYSIS, ROUTINE W REFLEX MICROSCOPIC    EKG EKG Interpretation  Date/Time:  Friday May 30 2022 16:48:42 EDT Ventricular Rate:  94 PR Interval:  138 QRS Duration: 106 QT Interval:  422 QTC Calculation: 415 R Axis:   -26 Text Interpretation: Sinus rhythm Ventricular bigeminy Borderline left axis deviation Repol abnrm suggests ischemia, lateral leads Since prior ECG, bigeminy is new Confirmed by Alvira Monday ([id number]) on 05/30/2022 6:17:02 PM  Radiology No results found.  Procedures Procedures  {Document cardiac monitor, telemetry assessment procedure when appropriate:1}  Medications Ordered in ED Medications  potassium chloride 10 mEq in 100 mL IVPB (10 mEq Intravenous New Bag/Given 05/30/22 1811)    ED Course/ Medical Decision Making/ A&P  Medical Decision Making Risk Prescription drug management.   ***  {Document critical care time when appropriate:1} {Document review of labs and clinical decision tools ie heart score, Chads2Vasc2 etc:1}  {Document your independent review of radiology images, and any outside records:1} {Document your discussion with family members, caretakers, and with consultants:1} {Document social determinants of health affecting pt's care:1} {Document your decision making why or why not admission, treatments were needed:1} Final Clinical Impression(s) / ED Diagnoses Final diagnoses:  None    Rx / DC Orders ED Discharge Orders     None

## 2022-05-30 NOTE — ED Provider Triage Note (Signed)
Emergency Medicine Provider Triage Evaluation Note  Brooke Owens , a 76 y.o. female  was evaluated in triage.  Pt complains of patient unsure why she is here. Triage note she is here from nursing home with abnormal labs. Seems confused. Denies any complaints.   Review of Systems  Positive: Abnormal lab Negative:   Physical Exam  BP 119/84 (BP Location: Left Wrist)   Pulse 69   Temp 98.6 F (37 C) (Oral)   Resp 16   SpO2 95%  Gen:   Awake, no distress   Resp:  Normal effort  MSK:   Moves extremities without difficulty  Other:  Confused A&O x2- name, DOB  Medical Decision Making  Medically screening exam initiated at 3:08 PM.  Appropriate orders placed.  Brooke Owens was informed that the remainder of the evaluation will be completed by another provider, this initial triage assessment does not replace that evaluation, and the importance of remaining in the ED until their evaluation is complete.  Abnormal lab, confused   Brooke Owens A, PA-C 05/30/22 1509
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
## 2022-05-30 NOTE — ED Notes (Signed)
Pt required multiple redirections into her triage room. Pt daughter brought to triage room and pt not found in the room. Security and Press photographer notified.

## 2022-05-31 DIAGNOSIS — E876 Hypokalemia: Secondary | ICD-10-CM | POA: Diagnosis not present

## 2022-05-31 MED ORDER — POTASSIUM CHLORIDE 10 MEQ/100ML IV SOLN
10.0000 meq | INTRAVENOUS | Status: AC
  Administered 2022-05-31 (×3): 10 meq via INTRAVENOUS
  Filled 2022-05-31 (×3): qty 100

## 2022-05-31 MED ORDER — POTASSIUM CHLORIDE CRYS ER 20 MEQ PO TBCR
40.0000 meq | EXTENDED_RELEASE_TABLET | Freq: Once | ORAL | Status: AC
  Administered 2022-05-31: 40 meq via ORAL
  Filled 2022-05-31: qty 2

## 2022-05-31 MED ORDER — POTASSIUM CHLORIDE CRYS ER 20 MEQ PO TBCR: 20.0000 meq | EXTENDED_RELEASE_TABLET | Freq: Two times a day (BID) | ORAL | 0 refills | Status: DC

## 2022-05-31 NOTE — Discharge Instructions (Addendum)
Please have your doctor recheck your potassium next week.

## 2022-05-31 NOTE — ED Notes (Signed)
Called patient daughter and shared that pt will be discharged shortly, nearly finished with last run of K. She plans to arrive at hospital in approx 

## 2022-06-23 ENCOUNTER — Emergency Department (HOSPITAL_COMMUNITY): Payer: Medicare Other

## 2022-06-23 ENCOUNTER — Other Ambulatory Visit: Payer: Self-pay

## 2022-06-23 ENCOUNTER — Emergency Department (HOSPITAL_COMMUNITY)
Admission: EM | Admit: 2022-06-23 | Discharge: 2022-06-23 | Disposition: A | Discharge status: Home / Self Care | Payer: Medicare Other | Attending: Emergency Medicine | Admitting: Emergency Medicine

## 2022-06-23 ENCOUNTER — Encounter (HOSPITAL_COMMUNITY): Payer: Self-pay

## 2022-06-23 DIAGNOSIS — M549 Dorsalgia, unspecified: Secondary | ICD-10-CM | POA: Insufficient documentation

## 2022-06-23 DIAGNOSIS — S0990XA Unspecified injury of head, initial encounter: Secondary | ICD-10-CM | POA: Diagnosis present

## 2022-06-23 DIAGNOSIS — W19XXXA Unspecified fall, initial encounter: Secondary | ICD-10-CM | POA: Diagnosis not present

## 2022-06-23 DIAGNOSIS — F0390 Unspecified dementia without behavioral disturbance: Secondary | ICD-10-CM | POA: Diagnosis not present

## 2022-06-23 DIAGNOSIS — S0083XA Contusion of other part of head, initial encounter: Secondary | ICD-10-CM | POA: Insufficient documentation

## 2022-06-23 LAB — CBC WITH DIFFERENTIAL/PLATELET
Abs Immature Granulocytes: 0.02 10*3/uL (ref 0.00–0.07)
Basophils Absolute: 0 10*3/uL (ref 0.0–0.1)
Basophils Relative: 1 %
Eosinophils Absolute: 0.1 10*3/uL (ref 0.0–0.5)
Eosinophils Relative: 1 %
HCT: 37.9 % (ref 36.0–46.0)
Hemoglobin: 12.6 g/dL (ref 12.0–15.0)
Immature Granulocytes: 0 %
Lymphocytes Relative: 23 %
Lymphs Abs: 1.9 10*3/uL (ref 0.7–4.0)
MCH: 29.6 pg (ref 26.0–34.0)
MCHC: 33.2 g/dL (ref 30.0–36.0)
MCV: 89 fL (ref 80.0–100.0)
Monocytes Absolute: 0.7 10*3/uL (ref 0.1–1.0)
Monocytes Relative: 8 %
Neutro Abs: 5.5 10*3/uL (ref 1.7–7.7)
Neutrophils Relative %: 67 %
Platelets: 229 10*3/uL (ref 150–400)
RBC: 4.26 MIL/uL (ref 3.87–5.11)
RDW: 13.8 % (ref 11.5–15.5)
WBC: 8.3 10*3/uL (ref 4.0–10.5)
nRBC: 0 % (ref 0.0–0.2)

## 2022-06-23 LAB — BASIC METABOLIC PANEL
Anion gap: 9 (ref 5–15)
BUN: 17 mg/dL (ref 8–23)
CO2: 27 mmol/L (ref 22–32)
Calcium: 8.8 mg/dL — ABNORMAL LOW (ref 8.9–10.3)
Chloride: 106 mmol/L (ref 98–111)
Creatinine, Ser: 1.02 mg/dL — ABNORMAL HIGH (ref 0.44–1.00)
GFR, Estimated: 57 mL/min — ABNORMAL LOW (ref >60)
Glucose, Bld: 107 mg/dL — ABNORMAL HIGH (ref 70–99)
Potassium: 2.9 mmol/L — ABNORMAL LOW (ref 3.5–5.1)
Sodium: 142 mmol/L (ref 135–145)

## 2022-06-23 MED ORDER — POTASSIUM CHLORIDE CRYS ER 20 MEQ PO TBCR
40.0000 meq | EXTENDED_RELEASE_TABLET | Freq: Once | ORAL | Status: AC
  Administered 2022-06-23: 40 meq via ORAL
  Filled 2022-06-23: qty 2

## 2022-06-23 MED ORDER — POTASSIUM CHLORIDE CRYS ER 20 MEQ PO TBCR: 40.0000 meq | EXTENDED_RELEASE_TABLET | Freq: Every day | ORAL | 0 refills | Status: DC

## 2022-06-23 NOTE — Discharge Instructions (Signed)
You were seen in the emergency department today for a fall.  The pictures of your brain, neck and back as well as your labs are normal.  Your potassium is low, which does not appear to be a new problem.  We gave you a dose of potassium here in the emergency department.  I am prescribing you potassium to take daily over the next 4 days.  Your potassium needs to be rechecked by your primary care provider in 1 week.  Please return to the emergency department for any concerning symptoms.

## 2022-06-23 NOTE — ED Notes (Signed)
Urine sent to lab for holding.  
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
## 2022-06-23 NOTE — ED Notes (Signed)
Report attempt called to [facility] with no answer. Pt. Left ED with her daughter
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
## 2022-10-26 ENCOUNTER — Emergency Department (HOSPITAL_COMMUNITY): Payer: Medicare Other

## 2022-10-26 ENCOUNTER — Emergency Department (HOSPITAL_COMMUNITY)
Admission: EM | Admit: 2022-10-26 | Discharge: 2022-10-26 | Disposition: A | Discharge status: Home / Self Care | Payer: Medicare Other | Attending: Emergency Medicine | Admitting: Emergency Medicine

## 2022-10-26 DIAGNOSIS — S0101XA Laceration without foreign body of scalp, initial encounter: Secondary | ICD-10-CM | POA: Insufficient documentation

## 2022-10-26 DIAGNOSIS — Y92129 Unspecified place in nursing home as the place of occurrence of the external cause: Secondary | ICD-10-CM | POA: Insufficient documentation

## 2022-10-26 DIAGNOSIS — M25462 Effusion, left knee: Secondary | ICD-10-CM | POA: Insufficient documentation

## 2022-10-26 DIAGNOSIS — W19XXXA Unspecified fall, initial encounter: Secondary | ICD-10-CM

## 2022-10-26 DIAGNOSIS — W06XXXA Fall from bed, initial encounter: Secondary | ICD-10-CM | POA: Diagnosis not present

## 2022-10-26 DIAGNOSIS — F0390 Unspecified dementia without behavioral disturbance: Secondary | ICD-10-CM | POA: Diagnosis not present

## 2022-10-26 DIAGNOSIS — S0990XA Unspecified injury of head, initial encounter: Secondary | ICD-10-CM | POA: Diagnosis present

## 2022-10-26 NOTE — ED Notes (Signed)
Pts daughter Cala Bradford updated regarding pt status and plan of care

## 2022-10-26 NOTE — ED Provider Notes (Addendum)
[facility]   CSN: [id number] Arrival date & time: 10/26/22  0046     History  Chief Complaint  Patient presents with   Brooke Owens    Ihor GullyBarbara Owens is a 76 y.o. female.  The history is provided by the EMS personnel. The history is limited by the condition of the patient.  Fall This is a new problem. The current episode started less than 1 hour ago. The problem occurs constantly. The problem has been resolved. Nothing aggravates the symptoms. Nothing relieves the symptoms. She has tried nothing for the symptoms. The treatment provided no relief.  Patient with dementia had a fall at the nursing home.       Home Medications Prior to Admission medications   Medication Sig Start Date End Date Taking? Authorizing Provider  atorvastatin (LIPITOR) 20 MG tablet Take 1 tablet (20 mg total) by mouth every other day. Patient taking differently: Take 20 mg by mouth at bedtime. 07/14/19   [name], [name], MD  donepezil (ARICEPT) 10 MG tablet Take 1/2 tablet daily for 2 weeks, then increase to 1 tablet daily Patient not taking: Reported on 06/23/2022 08/30/19   Van ClinesAquino, [name], MD  FLUoxetine (PROZAC) 20 MG capsule Take 20 mg by mouth in the morning.    [provider]  furosemide (LASIX) 20 MG tablet Take 20 mg by mouth See admin instructions. Take 20 mg by mouth at 4 PM daily    [provider]  furosemide (LASIX) 40 MG tablet Take 40 mg by mouth in the morning.    [provider]  levothyroxine (SYNTHROID) 88 MCG tablet Take 88 mcg by mouth daily before breakfast.    [provider]  LORazepam (ATIVAN) 2 MG tablet Take 0.5 tablets (1 mg total) by mouth at bedtime as needed for sleep. Patient not taking: Reported on 06/23/2022 04/21/19   [name], [name], MD  naproxen (NAPROSYN) 500 MG tablet Take 1 tablet (500 mg total) by mouth 2 (two) times daily with a meal. Patient not taking: Reported on  06/23/2022 09/21/13   [name], [name], MD  potassium chloride (KLOR-CON) 10 MEQ tablet Take 10 mEq by mouth in the morning.    [provider]  potassium chloride SA (KLOR-CON M) 20 MEQ tablet Take 2 tablets (40 mEq total) by mouth daily for 4 days. 06/23/22 06/27/22  [name], Lauren E, PA-C  zolpidem (AMBIEN) 10 MG tablet Take 0.5 tablets (5 mg total) by mouth at bedtime as needed for sleep. Patient not taking: Reported on 06/23/2022 04/21/19   [name], [name], MD      Allergies    Meperidine hcl    Review of Systems   Review of Systems  Unable to perform ROS: Dementia    Physical Exam Updated Vital Signs BP 127/70 (BP Location: Left Arm)   Pulse (!) 59   Temp 97.7 F (36.5 C) (Oral)   Resp 18   SpO2 97%  Physical Exam Vitals and nursing note reviewed. Exam conducted with a chaperone present.  Constitutional:      General: She is not in acute distress.    Appearance: Normal appearance. She is well-developed.  HENT:     Head: Normocephalic.      Nose: Nose normal.     Mouth/Throat:     Mouth: Mucous membranes are moist.  Eyes:     Pupils: Pupils are equal, round, and reactive to light.  Cardiovascular:  Rate and Rhythm: Normal rate and regular rhythm.     Pulses: Normal pulses.     Heart sounds: Normal heart sounds.  Pulmonary:     Effort: Pulmonary effort is normal. No respiratory distress.     Breath sounds: Normal breath sounds.  Abdominal:     General: Bowel sounds are normal. There is no distension.     Palpations: Abdomen is soft.     Tenderness: There is no abdominal tenderness. There is no guarding or rebound.  Genitourinary:    Vagina: No vaginal discharge.  Musculoskeletal:        General: Normal range of motion.     Right forearm: Normal.     Left forearm: Normal.     Right wrist: Normal. No tenderness, bony tenderness or snuff box tenderness.     Left wrist: Normal. No tenderness, bony tenderness or snuff box tenderness.     Right hand:  Normal.     Left hand: Normal.     Cervical back: Normal range of motion and neck supple.     Right hip: Normal.     Left hip: Normal.     Right knee: No bony tenderness or crepitus. No LCL laxity, MCL laxity, ACL laxity or PCL laxity. Normal alignment, normal meniscus and normal patellar mobility.     Left knee: No bony tenderness or crepitus. No LCL laxity, MCL laxity, ACL laxity or PCL laxity.Normal alignment, normal meniscus and normal patellar mobility.     Comments: No foreshortening or rotation FROM of BLE  Skin:    General: Skin is warm and dry.     Capillary Refill: Capillary refill takes less than 2 seconds.     Findings: No erythema or rash.  Neurological:     General: No focal deficit present.     Mental Status: She is alert.     Deep Tendon Reflexes: Reflexes normal.  Psychiatric:        Mood and Affect: Mood normal.     ED Results / Procedures / Treatments   Labs (all labs ordered are listed, but only abnormal results are displayed) Labs Reviewed - No data to display  EKG None  Radiology CT Knee Left Wo Contrast  Result Date: 10/26/2022 CLINICAL DATA:  Left knee trauma. Dislocation suspected. Potential fracture. EXAM: CT OF THE LEFT KNEE WITHOUT CONTRAST TECHNIQUE: Multidetector CT imaging of the left knee was performed according to the standard protocol. Multiplanar CT image reconstructions were also generated. RADIATION DOSE REDUCTION: This exam was performed according to the departmental dose-optimization program which includes automated exposure control, adjustment of the mA and/or kV according to patient size and/or use of iterative reconstruction technique. COMPARISON:  Contemporaneous left knee series. FINDINGS: Bones/Joint/Cartilage There is osteopenia and advanced degenerative arthrosis. There is partial femorotibial joint space loss, slightly more so laterally, moderate marginal osteophytosis and bone-on-bone patellofemoral joint space loss with bulky  osteophytosis. Lateral patellar drift likely degenerative etiology is seen up to 1 cm but there is no dislocation. Interosseous alignment is otherwise normal. Incidentally noted is a normal variant fabella. There is a small low-density suprapatellar bursal effusion. There is no evidence of fractures or loose bodies. There is small elongate popliteal cyst however, which contains a rod-like ossific body. No primary pathologic process or destructive lesion is seen. Mild spurring proximal tibiofibular joint. There is spurring at the tibial spines. Ligaments Suboptimally assessed by CT. Muscles and Tendons There is an intact extensor mechanism. Area tendons are not otherwise well seen with  this technique but appear grossly intact. There is a normal area muscle bulk. Minimal calcification in the popliteal artery. Soft tissues No significant findings. No hematoma. No acute inflammatory change. IMPRESSION: 1. Osteopenia and degenerative change without evidence of acute fractures. 2. Small suprapatellar bursal effusion. 3. Small elongate popliteal cyst containing a rod-like ossific body. Electronically Signed   By: Almira Bar M.D.   On: 10/26/2022 04:14   CT Head Wo Contrast  Result Date: 10/26/2022 CLINICAL DATA:  Fall from bed EXAM: CT HEAD WITHOUT CONTRAST CT CERVICAL SPINE WITHOUT CONTRAST TECHNIQUE: Multidetector CT imaging of the head and cervical spine was performed following the standard protocol without intravenous contrast. Multiplanar CT image reconstructions of the cervical spine were also generated. RADIATION DOSE REDUCTION: This exam was performed according to the departmental dose-optimization program which includes automated exposure control, adjustment of the mA and/or kV according to patient size and/or use of iterative reconstruction technique. COMPARISON:  None Available. FINDINGS: CT HEAD FINDINGS Brain: There is no mass, hemorrhage or extra-axial collection. There is generalized atrophy without  lobar predilection. There is hypoattenuation of the periventricular white matter, most commonly indicating chronic ischemic microangiopathy. Vascular: No abnormal hyperdensity of the major intracranial arteries or dural venous sinuses. No intracranial atherosclerosis. Skull: The visualized skull base, calvarium and extracranial soft tissues are normal. Sinuses/Orbits: No fluid levels or advanced mucosal thickening of the visualized paranasal sinuses. No mastoid or middle ear effusion. The orbits are normal. CT CERVICAL SPINE FINDINGS Alignment: No static subluxation. Facets are aligned. Occipital condyles are normally positioned. Skull base and vertebrae: No acute fracture. Soft tissues and spinal canal: No prevertebral fluid or swelling. No visible canal hematoma. Disc levels: No advanced spinal canal or neural foraminal stenosis. Upper chest: No pneumothorax, pulmonary nodule or pleural effusion. Other: Normal visualized paraspinal cervical soft tissues. IMPRESSION: 1. No acute intracranial abnormality. 2. Chronic ischemic microangiopathy and generalized atrophy. 3. No acute fracture or static subluxation of the cervical spine. Electronically Signed   By: Deatra Robinson M.D.   On: 10/26/2022 03:07   CT Cervical Spine Wo Contrast  Result Date: 10/26/2022 CLINICAL DATA:  Fall from bed EXAM: CT HEAD WITHOUT CONTRAST CT CERVICAL SPINE WITHOUT CONTRAST TECHNIQUE: Multidetector CT imaging of the head and cervical spine was performed following the standard protocol without intravenous contrast. Multiplanar CT image reconstructions of the cervical spine were also generated. RADIATION DOSE REDUCTION: This exam was performed according to the departmental dose-optimization program which includes automated exposure control, adjustment of the mA and/or kV according to patient size and/or use of iterative reconstruction technique. COMPARISON:  None Available. FINDINGS: CT HEAD FINDINGS Brain: There is no mass, hemorrhage or  extra-axial collection. There is generalized atrophy without lobar predilection. There is hypoattenuation of the periventricular white matter, most commonly indicating chronic ischemic microangiopathy. Vascular: No abnormal hyperdensity of the major intracranial arteries or dural venous sinuses. No intracranial atherosclerosis. Skull: The visualized skull base, calvarium and extracranial soft tissues are normal. Sinuses/Orbits: No fluid levels or advanced mucosal thickening of the visualized paranasal sinuses. No mastoid or middle ear effusion. The orbits are normal. CT CERVICAL SPINE FINDINGS Alignment: No static subluxation. Facets are aligned. Occipital condyles are normally positioned. Skull base and vertebrae: No acute fracture. Soft tissues and spinal canal: No prevertebral fluid or swelling. No visible canal hematoma. Disc levels: No advanced spinal canal or neural foraminal stenosis. Upper chest: No pneumothorax, pulmonary nodule or pleural effusion. Other: Normal visualized paraspinal cervical soft tissues. IMPRESSION: 1. No acute intracranial  abnormality. 2. Chronic ischemic microangiopathy and generalized atrophy. 3. No acute fracture or static subluxation of the cervical spine. Electronically Signed   By: Deatra Robinson M.D.   On: 10/26/2022 03:07   DG Knee Complete 4 Views Left  Result Date: 10/26/2022 CLINICAL DATA:  Fall EXAM: LEFT KNEE - COMPLETE 4+ VIEW COMPARISON:  None Available. FINDINGS: Osseous irregularity at the inferior margin of the patella and femur on lateral and oblique views. Otherwise, no evidence of fracture. No joint effusion. IMPRESSION: Osseous irregularity at the inferior margin of the patella and femur on lateral and oblique views, which may be degenerative or traumatic in nature. Consider correlation with CT. Electronically Signed   By: Deatra Robinson M.D.   On: 10/26/2022 02:38   DG Hip Unilat W or Wo Pelvis 2-3 Views Right  Result Date: 10/26/2022 CLINICAL DATA:  Fall  EXAM: DG HIP (WITH OR WITHOUT PELVIS) 2-3V RIGHT COMPARISON:  None Available. FINDINGS: Mild symmetric degenerative changes in the hips bilaterally. SI joints symmetric. No acute bony abnormality. Specifically, no fracture, subluxation, or dislocation. IMPRESSION: No acute bony abnormality. Electronically Signed   By: Charlett Nose M.D.   On: 10/26/2022 02:17    Procedures .Marland KitchenLaceration Repair  Date/Time: 10/26/2022 4:27 AM  Performed by: Cy Blamer, MD Authorized by: Cy Blamer, MD   Consent:    Consent obtained:  Emergent situation Universal protocol:    Patient identity confirmed:  Arm band Anesthesia:    Anesthesia method:  None Laceration details:    Location:  Scalp   Scalp location:  R temporal   Length (cm):  1   Depth (mm):  0.5 Pre-procedure details:    Preparation:  Patient was prepped and draped in usual sterile fashion Exploration:    Hemostasis achieved with:  Direct pressure   Imaging outcome: foreign body not noted     Wound exploration: wound explored through full range of motion     Wound extent: fascia not violated     Contaminated: no   Treatment:    Area cleansed with:  Povidone-iodine, chlorhexidine and saline   Amount of cleaning:  Extensive   Irrigation solution:  Sterile saline   Debridement:  None   Undermining:  None   Scar revision: no   Skin repair:    Repair method:  Staples   Number of staples:  3 Approximation:    Approximation:  Close Repair type:    Repair type:  Simple Post-procedure details:    Dressing:  Sterile dressing   Procedure completion:  Tolerated well, no immediate complications     Medications Ordered in ED Medications - No data to display  ED Course/ Medical Decision Making/ A&P                           Medical Decision Making Fall at nursing home, struck head   Amount and/or Complexity of Data Reviewed Independent Historian: EMS    Details: Fall at nursing home see above  External Data Reviewed:  radiology and notes.    Details: Previous notes and imaging reviewed  Radiology: ordered and independent interpretation performed.    Details: Negative head and C spine CT, hip is without fracture   Risk Risk Details: Staple removal in 5 days at urgent care.  Will refer to orthopedics for effusion.  Stable for discharge with close follow up.  Strict return precautions.      Final Clinical Impression(s) / ED Diagnoses Final diagnoses:  Knee effusion, left  Fall, initial encounter  Laceration of scalp, initial encounter   Return for intractable cough, coughing up blood, fevers > 100.4 unrelieved by medication, shortness of breath, intractable vomiting, chest pain, shortness of breath, weakness, numbness, changes in speech, facial asymmetry, abdominal pain, passing out, Inability to tolerate liquids or food, cough, altered mental status or any concerns. No signs of systemic illness or infection. The patient is nontoxic-appearing on exam and vital signs are within normal limits.  I have reviewed the triage vital signs and the nursing notes. Pertinent labs & imaging results that were available during my care of the patient were reviewed by me and considered in my medical decision making (see chart for details). After history, exam, and medical workup I feel the patient has been appropriately medically screened and is safe for discharge home. Pertinent diagnoses were discussed with the patient. Patient was given return precautions.  Rx / DC Orders ED Discharge Orders     None         Gera Inboden, MD 10/26/22 1610    Nicanor Alcon, Taler Kushner, MD 10/26/22 [id number]

## 2022-10-26 NOTE — ED Triage Notes (Signed)
Pt bib PT[facility] from [facility] after unwitnessed fall out of bed. Laceration noted to R side of head, bleeding controlled, wrapped with guaze by fire. No thinners, axo x1 at baseline. C/o L knee + R hip pain.   BP 162/72 SPO2 97% RA RR 16 CBG 93

## 2022-10-26 NOTE — ED Notes (Signed)
Pts laceration bandaged with nonadherent dressing and guaze

## 2022-10-26 NOTE — ED Notes (Signed)
Patient transported to CT 

## 2022-10-26 NOTE — ED Notes (Signed)
Patient transported to X-ray 

## 2022-10-26 NOTE — ED Notes (Signed)
RN reviewed discharge instructions with PTAR. Respirations even and unlabored, NAD at this time.

## 2022-12-23 ENCOUNTER — Emergency Department (HOSPITAL_COMMUNITY)
Admission: EM | Admit: 2022-12-23 | Discharge: 2022-12-24 | Disposition: A | Discharge status: Home / Self Care | Payer: Medicare Other | Attending: Emergency Medicine | Admitting: Emergency Medicine

## 2022-12-23 ENCOUNTER — Other Ambulatory Visit: Payer: Self-pay

## 2022-12-23 ENCOUNTER — Emergency Department (HOSPITAL_COMMUNITY): Payer: Medicare Other

## 2022-12-23 DIAGNOSIS — W06XXXA Fall from bed, initial encounter: Secondary | ICD-10-CM | POA: Diagnosis not present

## 2022-12-23 DIAGNOSIS — E876 Hypokalemia: Secondary | ICD-10-CM | POA: Insufficient documentation

## 2022-12-23 DIAGNOSIS — H6123 Impacted cerumen, bilateral: Secondary | ICD-10-CM | POA: Diagnosis not present

## 2022-12-23 DIAGNOSIS — Z79899 Other long term (current) drug therapy: Secondary | ICD-10-CM | POA: Insufficient documentation

## 2022-12-23 DIAGNOSIS — E039 Hypothyroidism, unspecified: Secondary | ICD-10-CM | POA: Diagnosis not present

## 2022-12-23 DIAGNOSIS — S0990XA Unspecified injury of head, initial encounter: Secondary | ICD-10-CM | POA: Diagnosis present

## 2022-12-23 DIAGNOSIS — F0390 Unspecified dementia without behavioral disturbance: Secondary | ICD-10-CM | POA: Insufficient documentation

## 2022-12-23 DIAGNOSIS — S0083XA Contusion of other part of head, initial encounter: Secondary | ICD-10-CM | POA: Diagnosis not present

## 2022-12-23 LAB — CBC WITH DIFFERENTIAL/PLATELET
Abs Immature Granulocytes: 0.01 10*3/uL (ref 0.00–0.07)
Basophils Absolute: 0 10*3/uL (ref 0.0–0.1)
Basophils Relative: 0 %
Eosinophils Absolute: 0.1 10*3/uL (ref 0.0–0.5)
Eosinophils Relative: 1 %
HCT: 36.7 % (ref 36.0–46.0)
Hemoglobin: 12 g/dL (ref 12.0–15.0)
Immature Granulocytes: 0 %
Lymphocytes Relative: 25 %
Lymphs Abs: 2.1 10*3/uL (ref 0.7–4.0)
MCH: 28.6 pg (ref 26.0–34.0)
MCHC: 32.7 g/dL (ref 30.0–36.0)
MCV: 87.4 fL (ref 80.0–100.0)
Monocytes Absolute: 0.8 10*3/uL (ref 0.1–1.0)
Monocytes Relative: 10 %
Neutro Abs: 5.3 10*3/uL (ref 1.7–7.7)
Neutrophils Relative %: 64 %
Platelets: 206 10*3/uL (ref 150–400)
RBC: 4.2 MIL/uL (ref 3.87–5.11)
RDW: 13.6 % (ref 11.5–15.5)
WBC: 8.2 10*3/uL (ref 4.0–10.5)
nRBC: 0 % (ref 0.0–0.2)

## 2022-12-23 MED ORDER — LACTATED RINGERS IV BOLUS
1000.0000 mL | Freq: Once | INTRAVENOUS | Status: AC
  Administered 2022-12-23: 1000 mL via INTRAVENOUS

## 2022-12-23 NOTE — ED Triage Notes (Signed)
Pt arrives EMS from [facility] memory care with possible fall. Per staff pt was found sitting on the end of bed with redness and abrasion to right side head. Pt does not take blood thinners. Pt arrives in c-collar placed by EMS due to hx of dementia. Pt is unable to verbalize what happened. Pt alert and oriented to self and place, which is baseline per facility staff.

## 2022-12-23 NOTE — ED Provider Notes (Signed)
[facility] Provider Note   CSN: [id number] Arrival date & time: 12/23/22  2114     History {Add pertinent medical, surgical, social history, OB history to HPI:1} Chief Complaint  Patient presents with   Brooke Owens is a 77 y.o. female who presents via EMS from [facility] memory care center with concern for fall this evening, unwitnessed.  Patient's daughter came at the bedside he states patient is normally oriented only to herself.  He is quite sleepy this evening though she did receive her as needed Ativan at her facility prior to transport.  Per daughter, patient nurse found her lying on the floor, abrasions to the right side of her face.  Patient reporting pain in the right side of her face, patient unable to provide any further insight into her symptomatology at this time.  Level 5 caveat due to patient's underlying dementia.  Patient is not anticoagulated.  I personally reviewed her medical records to history of hypothyroidism hyperlipidemia.  HPI     Home Medications Prior to Admission medications   Medication Sig Start Date End Date Taking? Authorizing Provider  atorvastatin (LIPITOR) 20 MG tablet Take 1 tablet (20 mg total) by mouth every other day. Patient taking differently: Take 20 mg by mouth at bedtime. 07/14/19   Isaac Bliss, Rayford Halsted, MD  donepezil (ARICEPT) 10 MG tablet Take 1/2 tablet daily for 2 weeks, then increase to 1 tablet daily Patient not taking: Reported on 06/23/2022 08/30/19   Cameron Sprang, MD  FLUoxetine (PROZAC) 20 MG capsule Take 20 mg by mouth in the morning.    [provider]  furosemide (LASIX) 20 MG tablet Take 20 mg by mouth See admin instructions. Take 20 mg by mouth at 4 PM daily    [provider]  furosemide (LASIX) 40 MG tablet Take 40 mg by mouth in the morning.    [provider]  levothyroxine (SYNTHROID) 88 MCG tablet Take 88 mcg by mouth daily  before breakfast.    [provider]  LORazepam (ATIVAN) 2 MG tablet Take 0.5 tablets (1 mg total) by mouth at bedtime as needed for sleep. Patient not taking: Reported on 06/23/2022 04/21/19   Isaac Bliss, Rayford Halsted, MD  naproxen (NAPROSYN) 500 MG tablet Take 1 tablet (500 mg total) by mouth 2 (two) times daily with a meal. Patient not taking: Reported on 06/23/2022 09/21/13   Noemi Chapel, MD  potassium chloride (KLOR-CON) 10 MEQ tablet Take 10 mEq by mouth in the morning.    [provider]  potassium chloride SA (KLOR-CON M) 20 MEQ tablet Take 2 tablets (40 mEq total) by mouth daily for 4 days. 06/23/22 06/27/22  Mickie Hillier, PA-C  zolpidem (AMBIEN) 10 MG tablet Take 0.5 tablets (5 mg total) by mouth at bedtime as needed for sleep. Patient not taking: Reported on 06/23/2022 04/21/19   Isaac Bliss, Rayford Halsted, MD      Allergies    Meperidine hcl    Review of Systems   Review of Systems  Unable to perform ROS: Dementia    Physical Exam Updated Vital Signs BP (!) 115/52   Pulse 62   Temp 97.7 F (36.5 C) (Oral)   Resp 18   SpO2 98%  Physical Exam Vitals and nursing note reviewed.  Constitutional:      Appearance: She is not ill-appearing or toxic-appearing.  HENT:     Head: Abrasion present. No raccoon  eyes, Battle's sign or laceration.      Right Ear: There is impacted cerumen.     Left Ear: There is impacted cerumen.     Nose:     Right Nostril: No epistaxis.     Left Nostril: No epistaxis.     Mouth/Throat:     Mouth: Mucous membranes are moist.     Pharynx: Oropharynx is clear. Uvula midline. No oropharyngeal exudate or posterior oropharyngeal erythema.     Tonsils: No tonsillar exudate.  Eyes:     General:        Right eye: No discharge.        Left eye: No discharge.     Extraocular Movements: Extraocular movements intact.     Conjunctiva/sclera: Conjunctivae normal.     Pupils: Pupils are equal, round, and reactive to light.  Neck:      Trachea: Trachea and phonation normal.  Cardiovascular:     Rate and Rhythm: Normal rate and regular rhythm.     Pulses: Normal pulses.     Heart sounds: Normal heart sounds. No murmur heard. Pulmonary:     Effort: Pulmonary effort is normal. No tachypnea, bradypnea, accessory muscle usage, prolonged expiration or respiratory distress.     Breath sounds: Normal breath sounds. No wheezing or rales.  Chest:     Chest wall: No mass, lacerations, deformity, swelling, tenderness, crepitus or edema.  Abdominal:     General: Bowel sounds are normal. There is no distension.     Palpations: Abdomen is soft.     Tenderness: There is no abdominal tenderness. There is no guarding or rebound.  Musculoskeletal:        General: No swelling or deformity.     Cervical back: Normal range of motion and neck supple. No spinous process tenderness.     Right lower leg: No edema.     Left lower leg: No edema.  Lymphadenopathy:     Cervical: No cervical adenopathy.  Skin:    General: Skin is warm and dry.     Capillary Refill: Capillary refill takes less than 2 seconds.     Findings: Abrasion present.  Neurological:     Mental Status: She is alert. Mental status is at baseline.  Psychiatric:        Mood and Affect: Mood normal.     ED Results / Procedures / Treatments   Labs (all labs ordered are listed, but only abnormal results are displayed) Labs Reviewed - No data to display  EKG None  Radiology No results found.  Procedures Procedures  {Document cardiac monitor, telemetry assessment procedure when appropriate:1}  Medications Ordered in ED Medications - No data to display  ED Course/ Medical Decision Making/ A&P   {   Click here for ABCD2, HEART and other calculatorsREFRESH Note before signing :1}                          Medical Decision Making 77 year old female presents with concern for fall.  Patient somewhat hypotensive at time my evaluation at bedside.  Systolic in the mid  32G, notified by RN that has decreased to the high 80s.  Will start IV access and initiate fluid bolus.  Possible contribution of Ativan at facility prior to arrival.  Imaging initiated given evidence of trauma to the face.  No apparent midline tenderness palpation of the spine though patient is unable to contribute much to her exam or history given her dementia.  Amount and/or Complexity of Data Reviewed Labs: ordered. Radiology: ordered.   ***  {Document critical care time when appropriate:1} {Document review of labs and clinical decision tools ie heart score, Chads2Vasc2 etc:1}  {Document your independent review of radiology images, and any outside records:1} {Document your discussion with family members, caretakers, and with consultants:1} {Document social determinants of health affecting pt's care:1} {Document your decision making why or why not admission, treatments were needed:1} Final Clinical Impression(s) / ED Diagnoses Final diagnoses:  None    Rx / DC Orders ED Discharge Orders     None

## 2022-12-24 DIAGNOSIS — S0083XA Contusion of other part of head, initial encounter: Secondary | ICD-10-CM | POA: Diagnosis not present

## 2022-12-24 LAB — BASIC METABOLIC PANEL
Anion gap: 9 (ref 5–15)
BUN: 15 mg/dL (ref 8–23)
CO2: 29 mmol/L (ref 22–32)
Calcium: 9 mg/dL (ref 8.9–10.3)
Chloride: 104 mmol/L (ref 98–111)
Creatinine, Ser: 0.89 mg/dL (ref 0.44–1.00)
GFR, Estimated: >60 mL/min (ref >60)
Glucose, Bld: 109 mg/dL — ABNORMAL HIGH (ref 70–99)
Potassium: 2.9 mmol/L — ABNORMAL LOW (ref 3.5–5.1)
Sodium: 142 mmol/L (ref 135–145)

## 2022-12-24 MED ORDER — POTASSIUM CHLORIDE CRYS ER 20 MEQ PO TBCR
40.0000 meq | EXTENDED_RELEASE_TABLET | Freq: Once | ORAL | Status: AC
  Administered 2022-12-24: 40 meq via ORAL
  Filled 2022-12-24: qty 2

## 2022-12-24 MED ORDER — POTASSIUM CHLORIDE 10 MEQ/100ML IV SOLN
10.0000 meq | INTRAVENOUS | Status: AC
  Administered 2022-12-24 (×2): 10 meq via INTRAVENOUS
  Filled 2022-12-24 (×2): qty 100

## 2022-12-24 MED ORDER — LACTATED RINGERS IV BOLUS
1000.0000 mL | Freq: Once | INTRAVENOUS | Status: AC
  Administered 2022-12-24: 1000 mL via INTRAVENOUS

## 2022-12-24 NOTE — Discharge Instructions (Signed)
Brooke Owens's workup in the ER today was reassuring.  She does not have any evidence of dangerous traumatic injury from suspected fall today.  She was found to have low potassium which should be repleted as prescribed for the next 3 days orally.  Should have ongoing serum potassium rechecks at facility to monitor for ongoing hypokalemia in context of diuretic use.  Return to ER with any severe symptoms

## 2022-12-31 ENCOUNTER — Telehealth: Payer: Self-pay

## 2022-12-31 NOTE — Telephone Encounter (Signed)
     Patient  visit on 1/31  at [facility]   Have you been able to follow up with your primary care physician? Yes   The patient was or was not able to obtain any needed medicine or equipment. Yes   Are there diet recommendations that you are having difficulty following? Na   Patient expresses understanding of discharge instructions and education provided has no other needs at this time.  Yes      Brooke Owens [facility] Guide, [place] [phone] [address], [place], [place] [address] Phone: [phone] Email: [email or web address]    

## 2023-02-02 ENCOUNTER — Emergency Department (HOSPITAL_COMMUNITY): Payer: Medicare Other

## 2023-02-02 ENCOUNTER — Other Ambulatory Visit: Payer: Self-pay

## 2023-02-02 ENCOUNTER — Emergency Department (HOSPITAL_COMMUNITY)
Admission: EM | Admit: 2023-02-02 | Discharge: 2023-02-02 | Disposition: A | Discharge status: Skilled Nursing Facility | Payer: Medicare Other | Attending: Emergency Medicine | Admitting: Emergency Medicine

## 2023-02-02 DIAGNOSIS — R7309 Other abnormal glucose: Secondary | ICD-10-CM | POA: Diagnosis not present

## 2023-02-02 DIAGNOSIS — S0003XA Contusion of scalp, initial encounter: Secondary | ICD-10-CM | POA: Diagnosis not present

## 2023-02-02 DIAGNOSIS — Y92129 Unspecified place in nursing home as the place of occurrence of the external cause: Secondary | ICD-10-CM | POA: Diagnosis not present

## 2023-02-02 DIAGNOSIS — S60221A Contusion of right hand, initial encounter: Secondary | ICD-10-CM | POA: Diagnosis not present

## 2023-02-02 DIAGNOSIS — W19XXXA Unspecified fall, initial encounter: Secondary | ICD-10-CM | POA: Insufficient documentation

## 2023-02-02 DIAGNOSIS — T1490XA Injury, unspecified, initial encounter: Secondary | ICD-10-CM | POA: Diagnosis present

## 2023-02-02 DIAGNOSIS — F0390 Unspecified dementia without behavioral disturbance: Secondary | ICD-10-CM | POA: Insufficient documentation

## 2023-02-02 DIAGNOSIS — S0083XA Contusion of other part of head, initial encounter: Secondary | ICD-10-CM | POA: Diagnosis not present

## 2023-02-02 LAB — CBG MONITORING, ED: Glucose-Capillary: 137 mg/dL — ABNORMAL HIGH (ref 70–99)

## 2023-02-02 NOTE — ED Notes (Signed)
Attempted to call report to staff at [facility] at [phone] without answer x2.

## 2023-02-02 NOTE — ED Triage Notes (Signed)
Pt BIBA from [facility] after having an unwitnessed fall - no anticoagulants, unknown down time. Staff at facility was unable to tell EMS what time they last saw her in bed, so they are unsure what time patient fell. Pt was found on the floor of her room. Pt states that she was trying to go to the bathroom. Pt has dementia and staff states pt's mentation is at baseline. Pt oriented to person but unable to state place or time. C collar in place upon arrival. Pt has large hematoma with swelling noted to L eye area as well as an abrasion. Pt also has bruise noted to R hand.

## 2023-02-02 NOTE — ED Provider Notes (Signed)
[place] EMERGENCY DEPARTMENT AT [facility] Provider Note   CSN: [id number] Arrival date & time: 02/02/23  0500     History  Chief Complaint  Patient presents with   Lytle Michaels    Brooke Owens is a 77 y.o. female.  77 year old female with a history of dementia, hyperlipidemia, thyroid disease, depression presents to the emergency department from [facility] after an unwitnessed fall.  Facility was unable to specify what time they last saw the patient in her bed.  She was found on the floor of her room.  Patient reported that she was trying to go to the bathroom at the time of the fall.  She is not on chronic anticoagulants.  Facility states that patient's mentation is at baseline.  The history is provided by the EMS personnel. No language interpreter was used.  Fall       Home Medications Prior to Admission medications   Medication Sig Start Date End Date Taking? Authorizing Provider  atorvastatin (LIPITOR) 20 MG tablet Take 1 tablet (20 mg total) by mouth every other day. Patient taking differently: Take 20 mg by mouth at bedtime. 07/14/19   Isaac Bliss, Rayford Halsted, MD  donepezil (ARICEPT) 10 MG tablet Take 1/2 tablet daily for 2 weeks, then increase to 1 tablet daily Patient not taking: Reported on 06/23/2022 08/30/19   Cameron Sprang, MD  FLUoxetine (PROZAC) 20 MG capsule Take 20 mg by mouth in the morning.    [provider]  furosemide (LASIX) 20 MG tablet Take 20 mg by mouth See admin instructions. Take 20 mg by mouth at 4 PM daily    [provider]  furosemide (LASIX) 40 MG tablet Take 40 mg by mouth in the morning.    [provider]  levothyroxine (SYNTHROID) 88 MCG tablet Take 88 mcg by mouth daily before breakfast.    [provider]  LORazepam (ATIVAN) 2 MG tablet Take 0.5 tablets (1 mg total) by mouth at bedtime as needed for sleep. Patient not taking: Reported on 06/23/2022 04/21/19   Isaac Bliss, Rayford Halsted, MD   naproxen (NAPROSYN) 500 MG tablet Take 1 tablet (500 mg total) by mouth 2 (two) times daily with a meal. Patient not taking: Reported on 06/23/2022 09/21/13   Noemi Chapel, MD  potassium chloride (KLOR-CON) 10 MEQ tablet Take 10 mEq by mouth in the morning.    [provider]  potassium chloride SA (KLOR-CON M) 20 MEQ tablet Take 2 tablets (40 mEq total) by mouth daily for 4 days. 06/23/22 06/27/22  Mickie Hillier, PA-C  zolpidem (AMBIEN) 10 MG tablet Take 0.5 tablets (5 mg total) by mouth at bedtime as needed for sleep. Patient not taking: Reported on 06/23/2022 04/21/19   Isaac Bliss, Rayford Halsted, MD      Allergies    Meperidine hcl    Review of Systems   Review of Systems Ten systems reviewed and are negative for acute change, except as noted in the HPI.    Physical Exam Updated Vital Signs BP (!) 111/53   Pulse 70   Temp (!) 97.4 F (36.3 C)   Resp 16   Ht '5\' 4"'$  (1.626 m)   Wt 80 kg   SpO2 97%   BMI 30.27 kg/m   Physical Exam Vitals and nursing note reviewed.  Constitutional:      General: She is not in acute distress.    Appearance: She is well-developed. She is not diaphoretic.     Comments:  Frail, elderly female  HENT:     Head: Normocephalic and atraumatic.      Nose:     Comments: Dried blood in nares Eyes:     General: No scleral icterus.    Conjunctiva/sclera: Conjunctivae normal.  Neck:     Comments: C-collar in place Cardiovascular:     Rate and Rhythm: Normal rate and regular rhythm.     Pulses: Normal pulses.  Pulmonary:     Effort: Pulmonary effort is normal. No respiratory distress.     Breath sounds: No stridor.     Comments: Respirations even and unlabored Musculoskeletal:        General: Normal range of motion.     Comments: No bony deformities, step-offs, crepitus to the thoracic or lumbosacral midline.  Skin:    General: Skin is warm and dry.     Coloration: Skin is not pale.     Findings: No erythema or rash.     Comments:  Bruising to dorsal aspect of the R hand along the ulnar aspect. TTP to the L humerus without crepitus or deformity. No hematoma or contusion to chest, abdomen, back, lower extremities.  Neurological:     Mental Status: She is alert.     Comments: Patient moving extremity spontaneously  Psychiatric:        Behavior: Behavior normal.     ED Results / Procedures / Treatments   Labs (all labs ordered are listed, but only abnormal results are displayed) Labs Reviewed  CBG MONITORING, ED - Abnormal; Notable for the following components:      Result Value   Glucose-Capillary 137 (*)    All other components within normal limits    EKG EKG Interpretation  Date/Time:  Monday February 02 2023 05:32:13 EDT Ventricular Rate:  78 PR Interval:    QRS Duration: 131 QT Interval:  444 QTC Calculation: 506 R Axis:   -29 Text Interpretation: Sinus rhythm Abnormal T, consider ischemia, lateral leads Artifact in lead(s) I II III aVR aVL aVF V1 V2 V3 V4 V5 V6 When compared with ECG of 06/23/2022, Artifact is now present Confirmed by Delora Fuel ([id number]) on 02/02/2023 5:39:22 AM  Radiology CT Head Wo Contrast  Result Date: 02/02/2023 CLINICAL DATA:  Fall with head, face and neck trauma. EXAM: CT HEAD WITHOUT CONTRAST CT MAXILLOFACIAL WITHOUT CONTRAST CT CERVICAL SPINE WITHOUT CONTRAST TECHNIQUE: Multidetector CT imaging of the head, cervical spine, and maxillofacial structures were performed using the standard protocol without intravenous contrast. Multiplanar CT image reconstructions of the cervical spine and maxillofacial structures were also generated. RADIATION DOSE REDUCTION: This exam was performed according to the departmental dose-optimization program which includes automated exposure control, adjustment of the mA and/or kV according to patient size and/or use of iterative reconstruction technique. COMPARISON:  Head and cervical spine CT both 12/23/2022, facial CT dated 02/14/2022. FINDINGS: CT HEAD  FINDINGS Brain: There is mild-to-moderate cerebral atrophy with atrophic ventriculomegaly and mild small-vessel disease of the cerebral white matter, mild cerebellar atrophy. No hemorrhage, infarct or mass are seen. There is no midline shift. There is dystrophic calcification again noted in the frontal falx. Vascular: There are calcifications in the distal vertebral arteries and both siphons. No hyperdense central vessels. Skull: There is osteopenia without evidence of fractures or focal lesions. There is a large left frontal scalp hematoma. Other: None. CT MAXILLOFACIAL FINDINGS Osseous: No fracture or mandibular dislocation. No destructive process. The bone mineralization is osteopenic. There is asymmetric narrowing and spurring of the left TMJ.  Orbits: The globes are intact. The bony orbits are intact. No intraorbital hematoma or mass is seen. No intraorbital edema is evident. Sinuses and mastoid air cells: Clear.  Nasal septum is midline. Soft tissues: Sizable left frontal scalp hematoma, extending somewhat over to the right, and continuing to the left over the temporofacial area with swelling continuing over the left brow and preorbital soft tissues and to a lesser extent over the anterior left cheek. The epiglottis and tongue base are normal. CT CERVICAL SPINE FINDINGS Alignment: No listhesis is seen. There is no widening of the anterior atlantodental joint. Bone-on-bone anterior atlantodental joint space loss again is noted with osteophytes. Skull base and vertebrae: There is osteopenia without evidence of fractures or suspicious bone lesions. No focal pathologic process. Soft tissues and spinal canal: No prevertebral fluid or swelling. No visible canal hematoma. Disc levels: The cervical discs are degenerated. There are small bidirectional osteophytes from C3-4 through C6-7. Posterior disc osteophyte complexes impress on the thecal sac from anteriorly at these levels but do not cause significant spinal  stenosis. Multilevel uncinate joint and facet spurs are noted with foraminal stenosis which is bilaterally moderate to severe at C3-4, moderate to severe on the left and moderate on the right at C4-5, moderate to severe on the left only at C5-6, moderate on the left only at C6-7. Upper chest: Negative. Other: None. IMPRESSION: 1. No acute intracranial CT findings or depressed skull fractures. 2. Large left frontal scalp hematoma with extension over the left face, left preorbital swelling. 3. Osteopenia and degenerative change without evidence of facial fractures. 4. Osteopenia and degenerative change of the cervical spine without evidence of fractures or malalignment. Electronically Signed   By: Telford Nab M.D.   On: 02/02/2023 06:24   CT Maxillofacial Wo Contrast  Result Date: 02/02/2023 CLINICAL DATA:  Fall with head, face and neck trauma. EXAM: CT HEAD WITHOUT CONTRAST CT MAXILLOFACIAL WITHOUT CONTRAST CT CERVICAL SPINE WITHOUT CONTRAST TECHNIQUE: Multidetector CT imaging of the head, cervical spine, and maxillofacial structures were performed using the standard protocol without intravenous contrast. Multiplanar CT image reconstructions of the cervical spine and maxillofacial structures were also generated. RADIATION DOSE REDUCTION: This exam was performed according to the departmental dose-optimization program which includes automated exposure control, adjustment of the mA and/or kV according to patient size and/or use of iterative reconstruction technique. COMPARISON:  Head and cervical spine CT both 12/23/2022, facial CT dated 02/14/2022. FINDINGS: CT HEAD FINDINGS Brain: There is mild-to-moderate cerebral atrophy with atrophic ventriculomegaly and mild small-vessel disease of the cerebral white matter, mild cerebellar atrophy. No hemorrhage, infarct or mass are seen. There is no midline shift. There is dystrophic calcification again noted in the frontal falx. Vascular: There are calcifications in the  distal vertebral arteries and both siphons. No hyperdense central vessels. Skull: There is osteopenia without evidence of fractures or focal lesions. There is a large left frontal scalp hematoma. Other: None. CT MAXILLOFACIAL FINDINGS Osseous: No fracture or mandibular dislocation. No destructive process. The bone mineralization is osteopenic. There is asymmetric narrowing and spurring of the left TMJ. Orbits: The globes are intact. The bony orbits are intact. No intraorbital hematoma or mass is seen. No intraorbital edema is evident. Sinuses and mastoid air cells: Clear.  Nasal septum is midline. Soft tissues: Sizable left frontal scalp hematoma, extending somewhat over to the right, and continuing to the left over the temporofacial area with swelling continuing over the left brow and preorbital soft tissues and to a lesser extent  over the anterior left cheek. The epiglottis and tongue base are normal. CT CERVICAL SPINE FINDINGS Alignment: No listhesis is seen. There is no widening of the anterior atlantodental joint. Bone-on-bone anterior atlantodental joint space loss again is noted with osteophytes. Skull base and vertebrae: There is osteopenia without evidence of fractures or suspicious bone lesions. No focal pathologic process. Soft tissues and spinal canal: No prevertebral fluid or swelling. No visible canal hematoma. Disc levels: The cervical discs are degenerated. There are small bidirectional osteophytes from C3-4 through C6-7. Posterior disc osteophyte complexes impress on the thecal sac from anteriorly at these levels but do not cause significant spinal stenosis. Multilevel uncinate joint and facet spurs are noted with foraminal stenosis which is bilaterally moderate to severe at C3-4, moderate to severe on the left and moderate on the right at C4-5, moderate to severe on the left only at C5-6, moderate on the left only at C6-7. Upper chest: Negative. Other: None. IMPRESSION: 1. No acute intracranial CT  findings or depressed skull fractures. 2. Large left frontal scalp hematoma with extension over the left face, left preorbital swelling. 3. Osteopenia and degenerative change without evidence of facial fractures. 4. Osteopenia and degenerative change of the cervical spine without evidence of fractures or malalignment. Electronically Signed   By: Telford Nab M.D.   On: 02/02/2023 06:24   CT Cervical Spine Wo Contrast  Result Date: 02/02/2023 CLINICAL DATA:  Fall with head, face and neck trauma. EXAM: CT HEAD WITHOUT CONTRAST CT MAXILLOFACIAL WITHOUT CONTRAST CT CERVICAL SPINE WITHOUT CONTRAST TECHNIQUE: Multidetector CT imaging of the head, cervical spine, and maxillofacial structures were performed using the standard protocol without intravenous contrast. Multiplanar CT image reconstructions of the cervical spine and maxillofacial structures were also generated. RADIATION DOSE REDUCTION: This exam was performed according to the departmental dose-optimization program which includes automated exposure control, adjustment of the mA and/or kV according to patient size and/or use of iterative reconstruction technique. COMPARISON:  Head and cervical spine CT both 12/23/2022, facial CT dated 02/14/2022. FINDINGS: CT HEAD FINDINGS Brain: There is mild-to-moderate cerebral atrophy with atrophic ventriculomegaly and mild small-vessel disease of the cerebral white matter, mild cerebellar atrophy. No hemorrhage, infarct or mass are seen. There is no midline shift. There is dystrophic calcification again noted in the frontal falx. Vascular: There are calcifications in the distal vertebral arteries and both siphons. No hyperdense central vessels. Skull: There is osteopenia without evidence of fractures or focal lesions. There is a large left frontal scalp hematoma. Other: None. CT MAXILLOFACIAL FINDINGS Osseous: No fracture or mandibular dislocation. No destructive process. The bone mineralization is osteopenic. There is  asymmetric narrowing and spurring of the left TMJ. Orbits: The globes are intact. The bony orbits are intact. No intraorbital hematoma or mass is seen. No intraorbital edema is evident. Sinuses and mastoid air cells: Clear.  Nasal septum is midline. Soft tissues: Sizable left frontal scalp hematoma, extending somewhat over to the right, and continuing to the left over the temporofacial area with swelling continuing over the left brow and preorbital soft tissues and to a lesser extent over the anterior left cheek. The epiglottis and tongue base are normal. CT CERVICAL SPINE FINDINGS Alignment: No listhesis is seen. There is no widening of the anterior atlantodental joint. Bone-on-bone anterior atlantodental joint space loss again is noted with osteophytes. Skull base and vertebrae: There is osteopenia without evidence of fractures or suspicious bone lesions. No focal pathologic process. Soft tissues and spinal canal: No prevertebral fluid or swelling.  No visible canal hematoma. Disc levels: The cervical discs are degenerated. There are small bidirectional osteophytes from C3-4 through C6-7. Posterior disc osteophyte complexes impress on the thecal sac from anteriorly at these levels but do not cause significant spinal stenosis. Multilevel uncinate joint and facet spurs are noted with foraminal stenosis which is bilaterally moderate to severe at C3-4, moderate to severe on the left and moderate on the right at C4-5, moderate to severe on the left only at C5-6, moderate on the left only at C6-7. Upper chest: Negative. Other: None. IMPRESSION: 1. No acute intracranial CT findings or depressed skull fractures. 2. Large left frontal scalp hematoma with extension over the left face, left preorbital swelling. 3. Osteopenia and degenerative change without evidence of facial fractures. 4. Osteopenia and degenerative change of the cervical spine without evidence of fractures or malalignment. Electronically Signed   By: Telford Nab M.D.   On: 02/02/2023 06:24   DG Humerus Left  Result Date: 02/02/2023 CLINICAL DATA:  77 year old female status post fall. EXAM: LEFT HUMERUS - 2+ VIEW COMPARISON:  None Available. FINDINGS: Bone mineralization is within normal limits for age. Alignment appears maintained at the left shoulder and elbow. There is no evidence of fracture or other focal bone lesions. Incidental left chest calcified breast implant. IMPRESSION: No acute fracture or dislocation identified about the left humerus. Electronically Signed   By: Genevie Ann M.D.   On: 02/02/2023 05:43    Procedures Procedures    Medications Ordered in ED Medications - No data to display  ED Course/ Medical Decision Making/ A&P Clinical Course as of 02/02/23 [id number]  Mon Feb 02, 2023  0621 X-ray of left humerus is negative for acute bony pathology.  Pending radiology interpretation of CT scans. [KH]  [phone] Spoke with patient's daughter about presentation and as well as results of workup.  Daughter verbalizes understanding.  All concerns addressed.  Daughter is comfortable with patient being transported back to her facility via [facility]. [KH]    Clinical Course User Index [KH] Antonietta Breach, PA-C                             Medical Decision Making Amount and/or Complexity of Data Reviewed Radiology: ordered. ECG/medicine tests: ordered.   This patient presents to the ED for concern of unwitnessed fall, this involves an extensive number of treatment options, and is a complaint that carries with it a high risk of complications and morbidity.  The differential diagnosis includes ICH vs skull fx vs concussion vs other fracture   Co morbidities that complicate the patient evaluation  Dementia HLD   Additional history obtained:  Additional history obtained from EMS External records from outside source obtained and reviewed including head CT on 12/23/2022 negative for acute intracranial abnormality   Lab Tests:  I Ordered, and  personally interpreted labs.  The pertinent results include:  CBG 137.   Imaging Studies ordered:  I ordered imaging studies including Xray L humerus and CTs head, face, neck  I independently visualized and interpreted imaging which showed no acute traumatic pathology. I agree with the radiologist interpretation   Cardiac Monitoring:  The patient was maintained on a cardiac monitor.  I personally viewed and interpreted the cardiac monitored which showed an underlying rhythm of: sinus rhythm   Medicines ordered and prescription drug management:  I have reviewed the patients home medicines and have made adjustments as needed   Test Considered:  Chem-8   Problem List / ED Course:  As above   Reevaluation:  After the interventions noted above, I reevaluated the patient and found that they have : remained stable   Social Determinants of Health:  ALF resident   Dispostion:  After consideration of the diagnostic results and the patients response to treatment, I feel that the patent would benefit from icing to manage swelling, tylenol PRN for pain. Able to follow with PCP as indicated. Patient to be transported back to [facility] via [facility].         Final Clinical Impression(s) / ED Diagnoses Final diagnoses:  Fall, initial encounter  Contusion of face, initial encounter  Hematoma of scalp, initial encounter    Rx / DC Orders ED Discharge Orders     None         Antonietta Breach, PA-C [id number]    Delora Fuel, [place] [id number] [phone]

## 2023-02-02 NOTE — Discharge Instructions (Signed)
Take Tylenol as needed for complaints of pain.  Follow-up with your primary care doctor for recheck, if desired.  Return for new or concerning symptoms.

## 2023-04-27 ENCOUNTER — Emergency Department (HOSPITAL_COMMUNITY): Payer: Medicare Other

## 2023-04-27 ENCOUNTER — Inpatient Hospital Stay (HOSPITAL_COMMUNITY)
Admission: EM | Admit: 2023-04-27 | Discharge: 2023-05-01 | DRG: 481 | Disposition: A | Discharge status: Skilled Nursing Facility | Payer: Medicare Other | Attending: Internal Medicine | Admitting: Internal Medicine

## 2023-04-27 ENCOUNTER — Other Ambulatory Visit: Payer: Self-pay

## 2023-04-27 DIAGNOSIS — E039 Hypothyroidism, unspecified: Secondary | ICD-10-CM | POA: Diagnosis present

## 2023-04-27 DIAGNOSIS — I959 Hypotension, unspecified: Secondary | ICD-10-CM | POA: Diagnosis present

## 2023-04-27 DIAGNOSIS — E7800 Pure hypercholesterolemia, unspecified: Secondary | ICD-10-CM | POA: Diagnosis present

## 2023-04-27 DIAGNOSIS — S72002A Fracture of unspecified part of neck of left femur, initial encounter for closed fracture: Secondary | ICD-10-CM | POA: Diagnosis present

## 2023-04-27 DIAGNOSIS — F32A Depression, unspecified: Secondary | ICD-10-CM | POA: Diagnosis present

## 2023-04-27 DIAGNOSIS — F0390 Unspecified dementia without behavioral disturbance: Secondary | ICD-10-CM | POA: Diagnosis present

## 2023-04-27 DIAGNOSIS — F0393 Unspecified dementia, unspecified severity, with mood disturbance: Secondary | ICD-10-CM | POA: Diagnosis present

## 2023-04-27 DIAGNOSIS — R296 Repeated falls: Secondary | ICD-10-CM | POA: Diagnosis present

## 2023-04-27 DIAGNOSIS — Z885 Allergy status to narcotic agent status: Secondary | ICD-10-CM

## 2023-04-27 DIAGNOSIS — Z6830 Body mass index (BMI) 30.0-30.9, adult: Secondary | ICD-10-CM

## 2023-04-27 DIAGNOSIS — E669 Obesity, unspecified: Secondary | ICD-10-CM | POA: Diagnosis present

## 2023-04-27 DIAGNOSIS — W1830XA Fall on same level, unspecified, initial encounter: Secondary | ICD-10-CM | POA: Diagnosis present

## 2023-04-27 DIAGNOSIS — Z79890 Hormone replacement therapy (postmenopausal): Secondary | ICD-10-CM

## 2023-04-27 DIAGNOSIS — S72012A Unspecified intracapsular fracture of left femur, initial encounter for closed fracture: Principal | ICD-10-CM | POA: Diagnosis present

## 2023-04-27 DIAGNOSIS — I493 Ventricular premature depolarization: Secondary | ICD-10-CM | POA: Diagnosis present

## 2023-04-27 DIAGNOSIS — Z90710 Acquired absence of both cervix and uterus: Secondary | ICD-10-CM

## 2023-04-27 DIAGNOSIS — Z888 Allergy status to other drugs, medicaments and biological substances status: Secondary | ICD-10-CM

## 2023-04-27 DIAGNOSIS — Z66 Do not resuscitate: Secondary | ICD-10-CM | POA: Diagnosis present

## 2023-04-27 DIAGNOSIS — E876 Hypokalemia: Secondary | ICD-10-CM | POA: Diagnosis present

## 2023-04-27 DIAGNOSIS — E860 Dehydration: Secondary | ICD-10-CM | POA: Diagnosis present

## 2023-04-27 DIAGNOSIS — Z79899 Other long term (current) drug therapy: Secondary | ICD-10-CM

## 2023-04-27 NOTE — ED Triage Notes (Signed)
Pt BIBEMS from [facility]. As per report pt had a fall over the weekend, mobile xray at facility confirmed Rt hip Fx. Pt c/o lower back pain & Rt hip pain

## 2023-04-27 NOTE — ED Provider Notes (Signed)
[place] EMERGENCY DEPARTMENT AT [facility] Provider Note   CSN: [id number] Arrival date & time: 04/27/23  2216     History {Add pertinent medical, surgical, social history, OB history to HPI:1} Chief Complaint  Patient presents with   Hip Pain    Pt BIBEMS from [facility] As per report pt had a fall over the weekend, mobile xray at facility confirmed Rt hip Fx. Pt c/o lower back pain & Rt hip pain    Kimbly Keville is a 77 y.o. female.  Patient is a 77 y/o female brought in by ambulance from [facility] for evaluation.  According to EMS the patient had a fall over the weekend.  X-rays taken at her facility showed a RIGHT hip fracture so she was transported here for further care.  She is a poor historian due to her dementia.  She points to her LEFT hip when asked where she has pain.  Patient not on chronic anticoagulation.  The history is provided by the EMS personnel. No language interpreter was used.  Hip Pain       Home Medications Prior to Admission medications   Medication Sig Start Date End Date Taking? Authorizing Provider  atorvastatin (LIPITOR) 20 MG tablet Take 1 tablet (20 mg total) by mouth every other day. Patient taking differently: Take 20 mg by mouth at bedtime. 07/14/19   Philip Aspen, Limmie Patricia, MD  donepezil (ARICEPT) 10 MG tablet Take 1/2 tablet daily for 2 weeks, then increase to 1 tablet daily Patient not taking: Reported on 06/23/2022 08/30/19   Van Clines, MD  FLUoxetine (PROZAC) 20 MG capsule Take 20 mg by mouth in the morning.    [provider]  furosemide (LASIX) 20 MG tablet Take 20 mg by mouth See admin instructions. Take 20 mg by mouth at 4 PM daily    [provider]  furosemide (LASIX) 40 MG tablet Take 40 mg by mouth in the morning.    [provider]  levothyroxine (SYNTHROID) 88 MCG tablet Take 88 mcg by mouth daily before breakfast.    [provider]  LORazepam (ATIVAN) 2 MG tablet  Take 0.5 tablets (1 mg total) by mouth at bedtime as needed for sleep. Patient not taking: Reported on 06/23/2022 04/21/19   Philip Aspen, Limmie Patricia, MD  naproxen (NAPROSYN) 500 MG tablet Take 1 tablet (500 mg total) by mouth 2 (two) times daily with a meal. Patient not taking: Reported on 06/23/2022 09/21/13   Eber Hong, MD  potassium chloride (KLOR-CON) 10 MEQ tablet Take 10 mEq by mouth in the morning.    [provider]  potassium chloride SA (KLOR-CON M) 20 MEQ tablet Take 2 tablets (40 mEq total) by mouth daily for 4 days. 06/23/22 06/27/22  Cristopher Peru, PA-C  zolpidem (AMBIEN) 10 MG tablet Take 0.5 tablets (5 mg total) by mouth at bedtime as needed for sleep. Patient not taking: Reported on 06/23/2022 04/21/19   Philip Aspen, Limmie Patricia, MD      Allergies    Meperidine hcl    Review of Systems   Review of Systems  Unable to perform ROS: Dementia    Physical Exam Updated Vital Signs BP (!) 99/45   Pulse 60   Temp 98 F (36.7 C) (Oral)   Ht 5\' 4"  (1.626 m)   Wt 80 kg   SpO2 98%   BMI 30.27 kg/m   Physical Exam Vitals and nursing note reviewed.  Constitutional:  General: She is not in acute distress.    Appearance: She is well-developed. She is not diaphoretic.     Comments: Nontoxic-appearing and in no acute distress  HENT:     Head: Normocephalic and atraumatic.     Comments: No hematoma or contusion to scalp.  No Battle sign or raccoon's eyes.  No skull instability. Eyes:     General: No scleral icterus.    Conjunctiva/sclera: Conjunctivae normal.  Cardiovascular:     Rate and Rhythm: Normal rate and regular rhythm.     Pulses: Normal pulses.     Comments: DP pulse 2+ b/l Pulmonary:     Effort: Pulmonary effort is normal. No respiratory distress.     Comments: Lungs CTAB. Respirations even and unlabored. Abdominal:     Comments: Abdomen soft, nondistended, nontender  Musculoskeletal:        General: Normal range of motion.     Cervical  back: Normal range of motion.     Comments: Question minimal leg shortening of the LLE; however, no malrotation. Full AROM and PROM of BLE. Extremities are warm, well perfused.  Skin:    General: Skin is warm and dry.     Coloration: Skin is not pale.     Findings: No erythema or rash.  Neurological:     Mental Status: She is alert. Mental status is at baseline.     Coordination: Coordination normal.  Psychiatric:        Behavior: Behavior normal.     ED Results / Procedures / Treatments   Labs (all labs ordered are listed, but only abnormal results are displayed) Labs Reviewed  CBC WITH DIFFERENTIAL/PLATELET  BASIC METABOLIC PANEL    EKG None  Radiology DG Chest 1 View  Result Date: 04/27/2023 CLINICAL DATA:  Recent fall with chest pain, initial encounter EXAM: PORTABLE CHEST 1 VIEW COMPARISON:  02/30/2024 FINDINGS: Cardiac shadow is within normal limits. Old granuloma is noted in the right upper lobe. Calcified left breast implant is noted. The lungs are clear. No bony abnormality is noted. IMPRESSION: No acute abnormality noted. Electronically Signed   By: Alcide Clever M.D.   On: 04/27/2023 23:37   DG Pelvis 1-2 Views  Result Date: 04/27/2023 CLINICAL DATA:  Fall EXAM: PELVIS - 1-2 VIEW COMPARISON:  Pelvis x-ray 12/23/2022 FINDINGS: There is a nondisplaced subcapital left femoral neck fracture. No dislocation. Joint spaces are maintained. Soft tissues are within normal limits. IMPRESSION: Nondisplaced subcapital left femoral neck fracture. Electronically Signed   By: Darliss Cheney M.D.   On: 04/27/2023 23:35    Procedures Procedures  {Document cardiac monitor, telemetry assessment procedure when appropriate:1}  Medications Ordered in ED Medications - No data to display  ED Course/ Medical Decision Making/ A&P Clinical Course as of 04/27/23 2356  Mon Apr 27, 2023  2308 Attempted to contact [facility] for further information. No answer at facility. [KH]  2340 Xray  pelvis with nondisplaced subcapital L femoral neck fx. [KH]  2349 Spoke with patient's daughter, [name], who is patient's POA. She is aware of the fracture and plan for admission with Orthopedic consultation. She reports that the patient's goals of care are: DNR/DNI. [KH]  2353 Consult placed to Orthopedics. [KH]    Clinical Course User Index [KH] Antony Madura, PA-C   {   Click here for ABCD2, HEART and other calculatorsREFRESH Note before signing :1}  Medical Decision Making Amount and/or Complexity of Data Reviewed Labs: ordered. Radiology: ordered.   ***  {Document critical care time when appropriate:1} {Document review of labs and clinical decision tools ie heart score, Chads2Vasc2 etc:1}  {Document your independent review of radiology images, and any outside records:1} {Document your discussion with family members, caretakers, and with consultants:1} {Document social determinants of health affecting pt's care:1} {Document your decision making why or why not admission, treatments were needed:1} Final Clinical Impression(s) / ED Diagnoses Final diagnoses:  Closed fracture of neck of left femur, initial encounter (HCC)    Rx / DC Orders ED Discharge Orders     None

## 2023-04-28 ENCOUNTER — Inpatient Hospital Stay (HOSPITAL_COMMUNITY): Payer: Medicare Other | Admitting: Anesthesiology

## 2023-04-28 ENCOUNTER — Encounter (HOSPITAL_COMMUNITY)
Admission: EM | Disposition: A | Discharge status: Skilled Nursing Facility | Payer: Self-pay | Source: Home / Self Care | Attending: Internal Medicine

## 2023-04-28 ENCOUNTER — Encounter (HOSPITAL_COMMUNITY): Payer: Self-pay | Admitting: Internal Medicine

## 2023-04-28 ENCOUNTER — Inpatient Hospital Stay (HOSPITAL_COMMUNITY): Payer: Medicare Other

## 2023-04-28 DIAGNOSIS — E669 Obesity, unspecified: Secondary | ICD-10-CM | POA: Diagnosis present

## 2023-04-28 DIAGNOSIS — E039 Hypothyroidism, unspecified: Secondary | ICD-10-CM | POA: Diagnosis present

## 2023-04-28 DIAGNOSIS — Z885 Allergy status to narcotic agent status: Secondary | ICD-10-CM | POA: Diagnosis not present

## 2023-04-28 DIAGNOSIS — S72002D Fracture of unspecified part of neck of left femur, subsequent encounter for closed fracture with routine healing: Secondary | ICD-10-CM | POA: Diagnosis not present

## 2023-04-28 DIAGNOSIS — W1830XA Fall on same level, unspecified, initial encounter: Secondary | ICD-10-CM | POA: Diagnosis present

## 2023-04-28 DIAGNOSIS — R296 Repeated falls: Secondary | ICD-10-CM | POA: Diagnosis present

## 2023-04-28 DIAGNOSIS — E876 Hypokalemia: Secondary | ICD-10-CM | POA: Diagnosis present

## 2023-04-28 DIAGNOSIS — S72002A Fracture of unspecified part of neck of left femur, initial encounter for closed fracture: Secondary | ICD-10-CM

## 2023-04-28 DIAGNOSIS — E7800 Pure hypercholesterolemia, unspecified: Secondary | ICD-10-CM | POA: Diagnosis present

## 2023-04-28 DIAGNOSIS — Z90710 Acquired absence of both cervix and uterus: Secondary | ICD-10-CM | POA: Diagnosis not present

## 2023-04-28 DIAGNOSIS — F0393 Unspecified dementia, unspecified severity, with mood disturbance: Secondary | ICD-10-CM | POA: Diagnosis present

## 2023-04-28 DIAGNOSIS — Z6830 Body mass index (BMI) 30.0-30.9, adult: Secondary | ICD-10-CM

## 2023-04-28 DIAGNOSIS — Z888 Allergy status to other drugs, medicaments and biological substances status: Secondary | ICD-10-CM | POA: Diagnosis not present

## 2023-04-28 DIAGNOSIS — I493 Ventricular premature depolarization: Secondary | ICD-10-CM | POA: Diagnosis present

## 2023-04-28 DIAGNOSIS — S72012A Unspecified intracapsular fracture of left femur, initial encounter for closed fracture: Secondary | ICD-10-CM | POA: Diagnosis present

## 2023-04-28 DIAGNOSIS — E860 Dehydration: Secondary | ICD-10-CM | POA: Diagnosis present

## 2023-04-28 DIAGNOSIS — Z79890 Hormone replacement therapy (postmenopausal): Secondary | ICD-10-CM | POA: Diagnosis not present

## 2023-04-28 DIAGNOSIS — Z66 Do not resuscitate: Secondary | ICD-10-CM | POA: Diagnosis present

## 2023-04-28 DIAGNOSIS — Z79899 Other long term (current) drug therapy: Secondary | ICD-10-CM | POA: Diagnosis not present

## 2023-04-28 DIAGNOSIS — I959 Hypotension, unspecified: Secondary | ICD-10-CM | POA: Diagnosis present

## 2023-04-28 HISTORY — PX: HIP PINNING,CANNULATED: SHX1758

## 2023-04-28 LAB — COMPREHENSIVE METABOLIC PANEL
ALT: 20 U/L (ref 0–44)
AST: 33 U/L (ref 15–41)
Albumin: 3 g/dL — ABNORMAL LOW (ref 3.5–5.0)
Alkaline Phosphatase: 73 U/L (ref 38–126)
Anion gap: 11 (ref 5–15)
BUN: 8 mg/dL (ref 8–23)
CO2: 25 mmol/L (ref 22–32)
Calcium: 8.6 mg/dL — ABNORMAL LOW (ref 8.9–10.3)
Chloride: 106 mmol/L (ref 98–111)
Creatinine, Ser: 0.77 mg/dL (ref 0.44–1.00)
GFR, Estimated: >60 mL/min (ref >60)
Glucose, Bld: 98 mg/dL (ref 70–99)
Potassium: 4.1 mmol/L (ref 3.5–5.1)
Sodium: 142 mmol/L (ref 135–145)
Total Bilirubin: 1.6 mg/dL — ABNORMAL HIGH (ref 0.3–1.2)
Total Protein: 6 g/dL — ABNORMAL LOW (ref 6.5–8.1)

## 2023-04-28 LAB — CBC WITH DIFFERENTIAL/PLATELET
Abs Immature Granulocytes: 0.02 10*3/uL (ref 0.00–0.07)
Abs Immature Granulocytes: 0.03 10*3/uL (ref 0.00–0.07)
Basophils Absolute: 0.1 10*3/uL (ref 0.0–0.1)
Basophils Absolute: 0.1 10*3/uL (ref 0.0–0.1)
Basophils Relative: 1 %
Basophils Relative: 1 %
Eosinophils Absolute: 0.2 10*3/uL (ref 0.0–0.5)
Eosinophils Absolute: 0.2 10*3/uL (ref 0.0–0.5)
Eosinophils Relative: 2 %
Eosinophils Relative: 2 %
HCT: 34.1 % — ABNORMAL LOW (ref 36.0–46.0)
HCT: 39.4 % (ref 36.0–46.0)
Hemoglobin: 11.1 g/dL — ABNORMAL LOW (ref 12.0–15.0)
Hemoglobin: 12.4 g/dL (ref 12.0–15.0)
Immature Granulocytes: 0 %
Immature Granulocytes: 0 %
Lymphocytes Relative: 19 %
Lymphocytes Relative: 26 %
Lymphs Abs: 1.5 10*3/uL (ref 0.7–4.0)
Lymphs Abs: 1.8 10*3/uL (ref 0.7–4.0)
MCH: 28.5 pg (ref 26.0–34.0)
MCH: 28.6 pg (ref 26.0–34.0)
MCHC: 31.5 g/dL (ref 30.0–36.0)
MCHC: 32.6 g/dL (ref 30.0–36.0)
MCV: 87.7 fL (ref 80.0–100.0)
MCV: 91 fL (ref 80.0–100.0)
Monocytes Absolute: 0.8 10*3/uL (ref 0.1–1.0)
Monocytes Absolute: 0.9 10*3/uL (ref 0.1–1.0)
Monocytes Relative: 10 %
Monocytes Relative: 12 %
Neutro Abs: 4.2 10*3/uL (ref 1.7–7.7)
Neutro Abs: 5.5 10*3/uL (ref 1.7–7.7)
Neutrophils Relative %: 59 %
Neutrophils Relative %: 68 %
Platelets: 190 10*3/uL (ref 150–400)
Platelets: 221 10*3/uL (ref 150–400)
RBC: 3.89 MIL/uL (ref 3.87–5.11)
RBC: 4.33 MIL/uL (ref 3.87–5.11)
RDW: 13.2 % (ref 11.5–15.5)
RDW: 13.4 % (ref 11.5–15.5)
WBC: 7.1 10*3/uL (ref 4.0–10.5)
WBC: 8.1 10*3/uL (ref 4.0–10.5)
nRBC: 0 % (ref 0.0–0.2)
nRBC: 0 % (ref 0.0–0.2)

## 2023-04-28 LAB — BASIC METABOLIC PANEL
Anion gap: 10 (ref 5–15)
BUN: 14 mg/dL (ref 8–23)
CO2: 29 mmol/L (ref 22–32)
Calcium: 8.5 mg/dL — ABNORMAL LOW (ref 8.9–10.3)
Chloride: 100 mmol/L (ref 98–111)
Creatinine, Ser: 0.85 mg/dL (ref 0.44–1.00)
GFR, Estimated: >60 mL/min (ref >60)
Glucose, Bld: 103 mg/dL — ABNORMAL HIGH (ref 70–99)
Potassium: 3.1 mmol/L — ABNORMAL LOW (ref 3.5–5.1)
Sodium: 139 mmol/L (ref 135–145)

## 2023-04-28 LAB — MAGNESIUM: Magnesium: 1.8 mg/dL (ref 1.7–2.4)

## 2023-04-28 LAB — PROTIME-INR
INR: 1.1 (ref 0.8–1.2)
Prothrombin Time: 14.1 seconds (ref 11.4–15.2)

## 2023-04-28 LAB — VITAMIN D 25 HYDROXY (VIT D DEFICIENCY, FRACTURES): Vit D, 25-Hydroxy: 26.82 ng/mL — ABNORMAL LOW (ref 30–100)

## 2023-04-28 SURGERY — FIXATION, FEMUR, NECK, PERCUTANEOUS, USING SCREW
Anesthesia: General | Site: Hip | Laterality: Left

## 2023-04-28 MED ORDER — DEXAMETHASONE SODIUM PHOSPHATE 10 MG/ML IJ SOLN
INTRAMUSCULAR | Status: DC | PRN
  Administered 2023-04-28: 10 mg via INTRAVENOUS

## 2023-04-28 MED ORDER — EPHEDRINE SULFATE-NACL 50-0.9 MG/10ML-% IV SOSY
PREFILLED_SYRINGE | INTRAVENOUS | Status: DC | PRN
  Administered 2023-04-28 (×2): 5 mg via INTRAVENOUS

## 2023-04-28 MED ORDER — DEXAMETHASONE SODIUM PHOSPHATE 10 MG/ML IJ SOLN
INTRAMUSCULAR | Status: AC
  Filled 2023-04-28: qty 1

## 2023-04-28 MED ORDER — CHLORHEXIDINE GLUCONATE 0.12 % MT SOLN: 15.0000 mL | Freq: Once | OROMUCOSAL | Status: AC

## 2023-04-28 MED ORDER — PHENYLEPHRINE HCL-NACL 20-0.9 MG/250ML-% IV SOLN
INTRAVENOUS | Status: DC | PRN
  Administered 2023-04-28: 20 ug/min via INTRAVENOUS

## 2023-04-28 MED ORDER — POTASSIUM CHLORIDE IN NACL 20-0.9 MEQ/L-% IV SOLN
INTRAVENOUS | Status: DC
  Filled 2023-04-28 (×5): qty 1000

## 2023-04-28 MED ORDER — ONDANSETRON HCL 4 MG/2ML IJ SOLN: 4.0000 mg | Freq: Once | INTRAMUSCULAR | Status: DC | PRN

## 2023-04-28 MED ORDER — PROPOFOL 10 MG/ML IV BOLUS
INTRAVENOUS | Status: DC | PRN
  Administered 2023-04-28: 100 mg via INTRAVENOUS

## 2023-04-28 MED ORDER — FENTANYL CITRATE (PF) 250 MCG/5ML IJ SOLN
INTRAMUSCULAR | Status: AC
  Filled 2023-04-28: qty 5

## 2023-04-28 MED ORDER — 0.9 % SODIUM CHLORIDE (POUR BTL) OPTIME
TOPICAL | Status: DC | PRN
  Administered 2023-04-28: 1000 mL

## 2023-04-28 MED ORDER — ROCURONIUM BROMIDE 10 MG/ML (PF) SYRINGE
PREFILLED_SYRINGE | INTRAVENOUS | Status: DC | PRN
  Administered 2023-04-28: 10 mg via INTRAVENOUS
  Administered 2023-04-28: 50 mg via INTRAVENOUS

## 2023-04-28 MED ORDER — ASPIRIN 325 MG PO TBEC
325.0000 mg | DELAYED_RELEASE_TABLET | Freq: Every day | ORAL | Status: DC
  Administered 2023-04-29 – 2023-05-01 (×3): 325 mg via ORAL
  Filled 2023-04-28 (×3): qty 1

## 2023-04-28 MED ORDER — POTASSIUM CHLORIDE 20 MEQ PO PACK: 60.0000 meq | PACK | Freq: Every day | ORAL | Status: DC

## 2023-04-28 MED ORDER — MIDAZOLAM HCL 2 MG/2ML IJ SOLN
INTRAMUSCULAR | Status: AC
  Filled 2023-04-28: qty 2

## 2023-04-28 MED ORDER — EPHEDRINE 5 MG/ML INJ
INTRAVENOUS | Status: AC
  Filled 2023-04-28: qty 5

## 2023-04-28 MED ORDER — ACETAMINOPHEN 325 MG PO TABS: 650.0000 mg | ORAL_TABLET | Freq: Four times a day (QID) | ORAL | Status: DC | PRN

## 2023-04-28 MED ORDER — SUGAMMADEX SODIUM 200 MG/2ML IV SOLN
INTRAVENOUS | Status: DC | PRN
  Administered 2023-04-28: 200 mg via INTRAVENOUS

## 2023-04-28 MED ORDER — ONDANSETRON HCL 4 MG/2ML IJ SOLN
4.0000 mg | Freq: Four times a day (QID) | INTRAMUSCULAR | Status: DC | PRN
  Administered 2023-04-30: 4 mg via INTRAVENOUS
  Filled 2023-04-28: qty 2

## 2023-04-28 MED ORDER — SODIUM CHLORIDE 0.9% FLUSH
3.0000 mL | Freq: Two times a day (BID) | INTRAVENOUS | Status: DC
  Administered 2023-04-28 – 2023-05-01 (×6): 3 mL via INTRAVENOUS

## 2023-04-28 MED ORDER — CEFAZOLIN SODIUM 1 G IJ SOLR
INTRAMUSCULAR | Status: AC
  Filled 2023-04-28: qty 20

## 2023-04-28 MED ORDER — OXYCODONE HCL 5 MG PO TABS: 5.0000 mg | ORAL_TABLET | Freq: Once | ORAL | Status: DC | PRN

## 2023-04-28 MED ORDER — LACTATED RINGERS IV SOLN: INTRAVENOUS | Status: DC

## 2023-04-28 MED ORDER — CHLORHEXIDINE GLUCONATE 0.12 % MT SOLN
OROMUCOSAL | Status: AC
  Administered 2023-04-28: 15 mL via OROMUCOSAL
  Filled 2023-04-28: qty 15

## 2023-04-28 MED ORDER — METOPROLOL TARTRATE 5 MG/5ML IV SOLN
INTRAVENOUS | Status: DC | PRN
  Administered 2023-04-28: 1 mg via INTRAVENOUS

## 2023-04-28 MED ORDER — ONDANSETRON HCL 4 MG/2ML IJ SOLN
INTRAMUSCULAR | Status: AC
  Filled 2023-04-28: qty 2

## 2023-04-28 MED ORDER — MORPHINE SULFATE (PF) 2 MG/ML IV SOLN
0.5000 mg | INTRAVENOUS | Status: DC | PRN
  Administered 2023-04-28 – 2023-05-01 (×4): 0.5 mg via INTRAVENOUS
  Filled 2023-04-28 (×4): qty 1

## 2023-04-28 MED ORDER — ACETAMINOPHEN 650 MG RE SUPP: 650.0000 mg | Freq: Four times a day (QID) | RECTAL | Status: DC | PRN

## 2023-04-28 MED ORDER — HYDROCODONE-ACETAMINOPHEN 5-325 MG PO TABS: 1.0000 | ORAL_TABLET | Freq: Four times a day (QID) | ORAL | Status: DC | PRN

## 2023-04-28 MED ORDER — CEFAZOLIN SODIUM-DEXTROSE 2-3 GM-%(50ML) IV SOLR
INTRAVENOUS | Status: DC | PRN
  Administered 2023-04-28: 2 g via INTRAVENOUS

## 2023-04-28 MED ORDER — SODIUM CHLORIDE 0.9 % IV BOLUS
1000.0000 mL | Freq: Once | INTRAVENOUS | Status: AC
  Administered 2023-04-28: 1000 mL via INTRAVENOUS

## 2023-04-28 MED ORDER — POTASSIUM CHLORIDE 10 MEQ/100ML IV SOLN
10.0000 meq | INTRAVENOUS | Status: AC
  Administered 2023-04-28 (×4): 10 meq via INTRAVENOUS
  Filled 2023-04-28 (×4): qty 100

## 2023-04-28 MED ORDER — FENTANYL CITRATE (PF) 100 MCG/2ML IJ SOLN: 25.0000 ug | INTRAMUSCULAR | Status: DC | PRN

## 2023-04-28 MED ORDER — ORAL CARE MOUTH RINSE: 15.0000 mL | Freq: Once | OROMUCOSAL | Status: AC

## 2023-04-28 MED ORDER — GLYCOPYRROLATE PF 0.2 MG/ML IJ SOSY
PREFILLED_SYRINGE | INTRAMUSCULAR | Status: DC | PRN
  Administered 2023-04-28: .2 mg via INTRAVENOUS

## 2023-04-28 MED ORDER — OXYCODONE HCL 5 MG/5ML PO SOLN: 5.0000 mg | Freq: Once | ORAL | Status: DC | PRN

## 2023-04-28 MED ORDER — FENTANYL CITRATE (PF) 250 MCG/5ML IJ SOLN
INTRAMUSCULAR | Status: DC | PRN
  Administered 2023-04-28: 25 ug via INTRAVENOUS
  Administered 2023-04-28 (×2): 50 ug via INTRAVENOUS

## 2023-04-28 MED ORDER — MELATONIN 3 MG PO TABS: 3.0000 mg | ORAL_TABLET | Freq: Every evening | ORAL | Status: DC | PRN

## 2023-04-28 MED ORDER — FENTANYL CITRATE PF 50 MCG/ML IJ SOSY: 25.0000 ug | PREFILLED_SYRINGE | INTRAMUSCULAR | Status: DC | PRN

## 2023-04-28 MED ORDER — LIDOCAINE 2% (20 MG/ML) 5 ML SYRINGE
INTRAMUSCULAR | Status: DC | PRN
  Administered 2023-04-28: 40 mg via INTRAVENOUS
  Administered 2023-04-28: 60 mg via INTRAVENOUS

## 2023-04-28 MED ORDER — NALOXONE HCL 0.4 MG/ML IJ SOLN: 0.4000 mg | INTRAMUSCULAR | Status: DC | PRN

## 2023-04-28 MED ORDER — ONDANSETRON HCL 4 MG/2ML IJ SOLN
INTRAMUSCULAR | Status: DC | PRN
  Administered 2023-04-28: 4 mg via INTRAVENOUS

## 2023-04-28 SURGICAL SUPPLY — 44 items
BAG COUNTER SPONGE SURGICOUNT (BAG) ×1 IMPLANT
BIT DRILL 4.8X300 (BIT) IMPLANT
BRUSH SCRUB EZ PLAIN DRY (MISCELLANEOUS) ×2 IMPLANT
COVER SURGICAL LIGHT HANDLE (MISCELLANEOUS) ×2 IMPLANT
DRAPE C-ARMOR (DRAPES) ×1 IMPLANT
DRAPE INCISE IOBAN 66X45 STRL (DRAPES) IMPLANT
DRAPE STERI IOBAN 125X83 (DRAPES) ×1 IMPLANT
DRESSING MEPILEX FLEX 4X4 (GAUZE/BANDAGES/DRESSINGS) ×1 IMPLANT
DRSG MEPILEX FLEX 4X4 (GAUZE/BANDAGES/DRESSINGS) ×1
ELECT PENCIL ROCKER SW 15FT (MISCELLANEOUS) IMPLANT
ELECT REM PT RETURN 9FT ADLT (ELECTROSURGICAL) ×1
ELECTRODE REM PT RTRN 9FT ADLT (ELECTROSURGICAL) ×1 IMPLANT
GLOVE BIO SURGEON STRL SZ7.5 (GLOVE) ×1 IMPLANT
GLOVE BIO SURGEON STRL SZ8 (GLOVE) ×1 IMPLANT
GLOVE BIOGEL PI IND STRL 7.5 (GLOVE) ×1 IMPLANT
GLOVE BIOGEL PI IND STRL 8 (GLOVE) ×1 IMPLANT
GLOVE SURG ORTHO LTX SZ7.5 (GLOVE) ×2 IMPLANT
GOWN STRL REUS W/ TWL LRG LVL3 (GOWN DISPOSABLE) ×2 IMPLANT
GOWN STRL REUS W/ TWL XL LVL3 (GOWN DISPOSABLE) ×1 IMPLANT
GOWN STRL REUS W/TWL LRG LVL3 (GOWN DISPOSABLE) ×2
GOWN STRL REUS W/TWL XL LVL3 (GOWN DISPOSABLE) ×1
KIT BASIN OR (CUSTOM PROCEDURE TRAY) ×1 IMPLANT
KIT TURNOVER KIT B (KITS) ×1 IMPLANT
MANIFOLD NEPTUNE II (INSTRUMENTS) ×1 IMPLANT
NS IRRIG 1000ML POUR BTL (IV SOLUTION) ×1 IMPLANT
PACK GENERAL/GYN (CUSTOM PROCEDURE TRAY) ×1 IMPLANT
PAD ARMBOARD 7.5X6 YLW CONV (MISCELLANEOUS) ×2 IMPLANT
PIN GUIDE DRIL TIP 2.8X300 STE (PIN) IMPLANT
PIN GUIDE DRILL TIP 2.8X300 (DRILL) IMPLANT
SCREW 8.0X80MMX16 (Screw) IMPLANT
SCREW CANN 8.0X70 16 THRD (Screw) IMPLANT
SCREW CANNULATED 8.0X70MM (Screw) ×2 IMPLANT
SCREW CANNULATED 8.0X75MM (Screw) IMPLANT
STAPLER VISISTAT 35W (STAPLE) ×1 IMPLANT
SUT VIC AB 0 CT1 27 (SUTURE)
SUT VIC AB 0 CT1 27XBRD ANBCTR (SUTURE) ×1 IMPLANT
SUT VIC AB 1 CT1 27 (SUTURE)
SUT VIC AB 1 CT1 27XBRD ANBCTR (SUTURE) ×1 IMPLANT
SUT VIC AB 2-0 CT1 27 (SUTURE)
SUT VIC AB 2-0 CT1 TAPERPNT 27 (SUTURE) ×1 IMPLANT
TOWEL GREEN STERILE (TOWEL DISPOSABLE) ×2 IMPLANT
TOWEL GREEN STERILE FF (TOWEL DISPOSABLE) ×1 IMPLANT
WASHER CANN FLAT 8 (Washer) IMPLANT
WATER STERILE IRR 1000ML POUR (IV SOLUTION) ×1 IMPLANT

## 2023-04-28 NOTE — Anesthesia Procedure Notes (Signed)
Procedure Name: Intubation Date/Time: 04/28/2023 4:04 PM  Performed by: Sharyn Dross, CRNAPre-anesthesia Checklist: Patient identified, Emergency Drugs available, Suction available and Patient being monitored Patient Re-evaluated:Patient Re-evaluated prior to induction Oxygen Delivery Method: Circle system utilized Preoxygenation: Pre-oxygenation with 100% oxygen Induction Type: IV induction Ventilation: Mask ventilation without difficulty Laryngoscope Size: Mac and 4 Grade View: Grade I Tube type: Oral Tube size: 7.0 mm Number of attempts: 1 Airway Equipment and Method: Stylet and Oral airway Placement Confirmation: ETT inserted through vocal cords under direct vision, positive ETCO2 and breath sounds checked- equal and bilateral Secured at: 22 cm Tube secured with: Tape Dental Injury: Teeth and Oropharynx as per pre-operative assessment

## 2023-04-28 NOTE — Transfer of Care (Signed)
Immediate Anesthesia Transfer of Care Note  Patient: Brooke Owens  Procedure(s) Performed: CANNULATED HIP PINNING (Left: Hip)  Patient Location: PACU  Anesthesia Type:General  Level of Consciousness: drowsy and patient cooperative  Airway & Oxygen Therapy: Patient connected to face mask oxygen  Post-op Assessment: Report given to RN and Post -op Vital signs reviewed and stable  Post vital signs: Reviewed and stable  Last Vitals:  Vitals Value Taken Time  BP 136/74 04/28/23 1723  Temp    Pulse    Resp 26 04/28/23 1725  SpO2    Vitals shown include unvalidated device data.  Last Pain:  Vitals:   04/28/23 1513  TempSrc: Axillary  PainSc:          Complications: There were no known notable events for this encounter.

## 2023-04-28 NOTE — Progress Notes (Signed)
I was notified last night of Brooke Owens' left hip fracture. She has since been admitted to medicine. Patient will be evaluated today for surgery, possibly later today. Please maintain NPO status for now.

## 2023-04-28 NOTE — ED Notes (Signed)
Ortho Provider at bedside. 

## 2023-04-28 NOTE — Plan of Care (Signed)

## 2023-04-28 NOTE — H&P (Addendum)
History and Physical    Patient: Brooke Owens ZOX:[id number] DOB: 1946-10-20 DOA: 04/27/2023 DOS: the patient was seen and examined on 04/28/2023 PCP: Shon Hale, MD  Patient coming from: SNF-  Chief Complaint:  Chief Complaint  Patient presents with   Hip Pain    Pt BIBEMS from [facility]. As per report pt had a fall over the weekend, mobile xray at facility confirmed Rt hip Fx. Pt c/o lower back pain & Rt hip pain   HPI: Brooke Owens is a 77 y.o. female with medical history significant of advanced dementia, hypothyroidism and dyslipidemia.  This past weekend patient apparently had fall at the facility but began complaining of pain on Monday, June 3.  Will x-ray completed at the facility revealed a hip fracture and patient was sent to the ER for further evaluation.  Patient has been brought to the ER a total of 7 times in the past 12 months for complaints of fall at facility.  Upon arrival to the ER the patient was afebrile with a BP of 99/45 and room air saturations 98%.  Imaging did reveal a nondisplaced subcapital left femoral neck fracture.  Labs were unremarkable except for potassium of 3.1 which was replaced both orally and IV by ED staff.  ER staff spoke with patient's power of attorney who is also her daughter Brooke Owens.  They were aware of patient's need for admission and confirmed patient is DNR/DNI status.  Night call orthopedic physician Dr. Yevette Edwards was also notified of need for consultation and documented that formal consultation would be obtained early this morning.  Hospitalist service was consulted for admission.  Upon my evaluation of the patient she was pleasant and was reporting left hip pain after minimal palpation.  She is very confused and otherwise is unable to contribute meaningfully to history.  Review of Systems: As mentioned in the history of present illness. All other systems reviewed and are negative. Past Medical History:  Diagnosis Date    Dementia (HCC)    Depression    Hypercholesteremia    Thyroid disease    Past Surgical History:  Procedure Laterality Date   ABDOMINAL HYSTERECTOMY     BREAST ENHANCEMENT SURGERY     Social History:  reports that she has never smoked. She has never used smokeless tobacco. She reports that she does not currently use alcohol. She reports that she does not use drugs.  Allergies  Allergen Reactions   Meperidine Hcl Nausea And Vomiting and Other (See Comments)    DEMEROL    No family history on file.  Prior to Admission medications   Medication Sig Start Date End Date Taking? Authorizing Provider  atorvastatin (LIPITOR) 20 MG tablet Take 1 tablet (20 mg total) by mouth every other day. Patient taking differently: Take 20 mg by mouth at bedtime. 07/14/19   Philip Aspen, Limmie Patricia, MD  donepezil (ARICEPT) 10 MG tablet Take 1/2 tablet daily for 2 weeks, then increase to 1 tablet daily Patient not taking: Reported on 06/23/2022 08/30/19   Van Clines, MD  FLUoxetine (PROZAC) 20 MG capsule Take 20 mg by mouth in the morning.    [provider]  furosemide (LASIX) 20 MG tablet Take 20 mg by mouth See admin instructions. Take 20 mg by mouth at 4 PM daily    [provider]  furosemide (LASIX) 40 MG tablet Take 40 mg by mouth in the morning.    [provider]  levothyroxine (SYNTHROID) 88 MCG tablet Take  88 mcg by mouth daily before breakfast.    [provider]  LORazepam (ATIVAN) 2 MG tablet Take 0.5 tablets (1 mg total) by mouth at bedtime as needed for sleep. Patient not taking: Reported on 06/23/2022 04/21/19   Philip Aspen, Limmie Patricia, MD  naproxen (NAPROSYN) 500 MG tablet Take 1 tablet (500 mg total) by mouth 2 (two) times daily with a meal. Patient not taking: Reported on 06/23/2022 09/21/13   Eber Hong, MD  potassium chloride (KLOR-CON) 10 MEQ tablet Take 10 mEq by mouth in the morning.    [provider]  potassium chloride SA  (KLOR-CON M) 20 MEQ tablet Take 2 tablets (40 mEq total) by mouth daily for 4 days. 06/23/22 06/27/22  Cristopher Peru, PA-C  zolpidem (AMBIEN) 10 MG tablet Take 0.5 tablets (5 mg total) by mouth at bedtime as needed for sleep. Patient not taking: Reported on 06/23/2022 04/21/19   Philip Aspen, Limmie Patricia, MD    Physical Exam: Vitals:   04/27/23 2356 04/28/23 0031 04/28/23 0327 04/28/23 0606  BP: (!) 99/45 (!) 93/49 (!) 114/98 (!) 111/90  Pulse: 60 (!) 59  77  Resp:    20  Temp: 98 F (36.7 C)   98.4 F (36.9 C)  TempSrc: Oral   Axillary  SpO2: 98% 95%  100%  Weight:      Height:       Constitutional: NAD, calm, comfortable Respiratory: clear to auscultation bilaterally, no wheezing, no crackles. Normal respiratory effort. No accessory muscle use.  Cardiovascular: Regular rate and rhythm, no murmurs / rubs / gallops. No extremity edema. 2+ pedal pulse on left. Abdomen: Mild tenderness to palpation over suprapubic area, no masses palpated. No hepatosplenomegaly. Bowel sounds positive.  Musculoskeletal: no clubbing / cyanosis. No obvious joint deformity upper and lower extremities.  Tender over left hip region no contractures. Normal muscle tone.  Skin: no rashes, lesions, ulcers. No induration Neurologic: CN 2-12 grossly intact. Sensation intact,  Strength 4/5 x all 4 extremities.  Psychiatric: Parke Simmers affect, pleasant.  Oriented to name only.  Unable to tell me why she is in the hospital.  Speech is unintelligible and at other times she is able to asked to have her blanket pulled up.  Data Reviewed:  Sodium 139, potassium 3.1, BUN 14, creatinine 0.85, white count 7100 with normal differential otherwise, hemoglobin 11.1, platelets 190,000  Pelvic x-ray as above  Chest x-ray unremarkable  Twelve-lead EKG twelve-lead EKG with sinus rhythm occasional PVCs with QTc 468 ms  Assessment and Plan: Fall at skilled nursing facility with associated left femoral neck fracture Patient with a  history of multiple recurrent falls Formal orthopedic consultation pending but likely patient will undergo surgical procedure later today Hip fracture order set initiated N.p.o. SCDs and aspirin for DVT prophylaxis PT and OT consultations pending for a.m.-currently nonweightbearing Apparently was ambulatory prior to admission Both narcotic and nonnarcotic pain management Bladder scan revealed 500 cc residual with patient voiding small amounts.  Will go ahead and place Foley catheter preoperatively will obtain UA/urine culture concurrently. Lee criteria 0.4% risk of cardiac surgical complications which indicates very low risk for proceeding with surgery  Acute hypokalemia Initial potassium 3.1 and patient has been given both oral and IV potassium replacement Repeat labs in a.m.  Suspected dehydration Patient's blood pressure was low in the 90s supine at presentation with an MAP of 61 and this potentially could have contributed to patient's fall if she was orthostatic from low blood pressure Old med  rec shows 2 separate orders for Lasix and unclear if patient is still taking Since n.p.o. will continue maintenance fluids at 75 cc/h  Advanced dementia Patient is DNR SLP evaluation as recommended and hip fracture order set Monitor for acute delirium during hospitalization Med rec still pending and unclear if patient remains on Aricept, Prozac and as needed Ativan  Hypothyroidism Check TSH Continue levothyroxine status clarified  Dyslipidemia Previously on Lipitor and can resume once medication and dose clarified    Advance Care Planning:   Code Status: DNR   VTE prophylaxis: SCDs and aspirin  Consults: Orthopedics  Family Communication: No family at bedside.  Family updated overnight due to patient's acute injury and need for hospitalization by ED staff  Severity of Illness: The appropriate patient status for this patient is INPATIENT. Inpatient status is judged to be  reasonable and necessary in order to provide the required intensity of service to ensure the patient's safety. The patient's presenting symptoms, physical exam findings, and initial radiographic and laboratory data in the context of their chronic comorbidities is felt to place them at high risk for further clinical deterioration. Furthermore, it is not anticipated that the patient will be medically stable for discharge from the hospital within 2 midnights of admission.   * I certify that at the point of admission it is my clinical judgment that the patient will require inpatient hospital care spanning beyond 2 midnights from the point of admission due to high intensity of service, high risk for further deterioration and high frequency of surveillance required.*  Author: [name], NP 04/28/2023 7:55 AM  For on call review [email or web address].

## 2023-04-28 NOTE — ED Notes (Signed)
Pt incontinent of urine twice. Bladder scan preformed again.  520 mL measured with ultra sound.   RN notified.

## 2023-04-28 NOTE — Evaluation (Signed)
Clinical/Bedside Swallow Evaluation Patient Details  Name: Brooke Owens MRN: [id number] Date of Birth: 1946-04-01  Today's Date: 04/28/2023 Time: SLP Start Time (ACUTE ONLY): 1045 SLP Stop Time (ACUTE ONLY): 1100 SLP Time Calculation (min) (ACUTE ONLY): 15 min  Past Medical History:  Past Medical History:  Diagnosis Date   Dementia (HCC)    Depression    Hypercholesteremia    Thyroid disease    Past Surgical History:  Past Surgical History:  Procedure Laterality Date   ABDOMINAL HYSTERECTOMY     BREAST ENHANCEMENT SURGERY     HPI:    Patient is a 77 y.o. female with PMH: advanced dementia, hypothyroidism, depression. She presented to the ED on 04/27/23 after having fall over the weekend and c/o pain on 6/3. X-ray at facility revealed hip fracture and patient was sent to ER for further evaluation. Of note, patient has been brought to the ER a total of 7 times in past 12 months for c/o fall at facility. Upon arrival to ED, imaging showed nondisplaced subcapital left femoral neck fracture; labs unremarkable except for potassium at 3/1.    Assessment / Plan / Recommendation  Clinical Impression  Patient is not currently presenting with clinical s/s of dysphagia as per this bedside swallow evaluation. She was awake, alert and pleasant throughout evaluation. She tolerated cup and straw sips of thin liquids (water), puree solids (applesauce) and soft solids (graham cracker) without overt s/s aspiration. Mastication and oral transit of boluses appeared normal and swallow initiation appeared timely. Patient was able to feed self when PO's handed to her but she may require assistance with feeding self at meal time. SLP recommending Dys 3 (mechanical soft) solids and thin liquids with no f/u needed. If she has full supervision and assistance with meals and tolerates Dys 3 solids, she could be advanced to regular texture solids without further SLP intervention. SLP Visit Diagnosis: Dysphagia,  unspecified (R13.10)    Aspiration Risk  No limitations;Mild aspiration risk    Diet Recommendation Dysphagia 3 (Mech soft);Thin liquid   Liquid Administration via: Cup;Straw Medication Administration: Whole meds with liquid Supervision: Patient able to self feed;Full supervision/cueing for compensatory strategies Compensations: Minimize environmental distractions;Slow rate;Small sips/bites Postural Changes: Seated upright at 90 degrees    Other  Recommendations Oral Care Recommendations: Oral care BID    Recommendations for follow up therapy are one component of a multi-disciplinary discharge planning process, led by the attending physician.  Recommendations may be updated based on patient status, additional functional criteria and insurance authorization.  Follow up Recommendations No SLP follow up      Assistance Recommended at Discharge    Functional Status Assessment Patient has not had a recent decline in their functional status  Frequency and Duration   N/A       Prognosis    N/A    Swallow Study   General Date of Onset: 04/27/23 Type of Study: Bedside Swallow Evaluation Previous Swallow Assessment: none found Diet Prior to this Study: NPO Temperature Spikes Noted: No Respiratory Status: Room air History of Recent Intubation: No Behavior/Cognition: Alert;Cooperative;Pleasant mood Oral Cavity Assessment: Within Functional Limits Oral Care Completed by SLP: Recent completion by staff Oral Cavity - Dentition: Adequate natural dentition Vision: Functional for self-feeding Self-Feeding Abilities: Able to feed self;Needs set up Patient Positioning: Upright in bed Baseline Vocal Quality: Normal Volitional Cough: Cognitively unable to elicit Volitional Swallow: Unable to elicit    Oral/Motor/Sensory Function Overall Oral Motor/Sensory Function: Other (comment) (mild tremor of jaw)  Ice Chips Ice chips: Not tested   Thin Liquid Thin Liquid: Within functional  limits Presentation: Straw;Cup;Self Fed    Nectar Thick     Honey Thick     Puree Puree: Within functional limits Presentation: Spoon   Solid     Solid: Within functional limits Presentation: Self Fed      Angela Nevin, MA, CCC-SLP Speech Therapy

## 2023-04-28 NOTE — Consult Note (Addendum)
As the orthopaedic surgeon on call, I have seen and examined the patient at the direction of the primary service. I have personally reviewed and discussed in detail with Brooke Igo, PA-C, the patient's presentation, progress, and confirmed the examination findings; I have also reviewed and interpreted the x-rays and laboratory studies; and I formulated the plan for treatment which is outlined above, in addition to communicating with the primary service.   The risks and benefits of surgery for left hip cannulated pinning were discussed with the patient's daughter, including the possibility of infection, nerve injury, vessel injury, wound breakdown, arthritis, symptomatic hardware, DVT/ PE, loss of motion, malunion, nonunion, and need for further surgery among others.  These risks were acknowledged and consent provided to proceed.  Brooke Galas, MD [facility], [facility] [phone]       Reason for Consult:Left hip fx Referring Physician: Junious Owens Time called: 0730 Time at bedside: 0944   Brooke Owens is an 77 y.o. female.  HPI: Brooke Owens fell at the SNF where she [facility]. X-rays there showed a left hip fx and she was sent to the ED and orthopedic surgery was consulted. She is moderately demented and cannot contribute to history.  Past Medical History:  Diagnosis Date   Dementia (HCC)    Depression    Hypercholesteremia    Thyroid disease     Past Surgical History:  Procedure Laterality Date   ABDOMINAL HYSTERECTOMY     BREAST ENHANCEMENT SURGERY      No family history on file.  Social History:  reports that she has never smoked. She has never used smokeless tobacco. She reports that she does not currently use alcohol. She reports that she does not use drugs.  Allergies:  Allergies  Allergen Reactions   Meperidine Hcl Nausea And Vomiting and Other (See Comments)    DEMEROL    Medications: I have reviewed the patient's current  medications.  Results for orders placed or performed during the hospital encounter of 04/27/23 (from the past 48 hour(s))  CBC with Differential     Status: Abnormal   Collection Time: 04/28/23 12:00 AM  Result Value Ref Range   WBC 7.1 4.0 - 10.5 K/uL   RBC 3.89 3.87 - 5.11 MIL/uL   Hemoglobin 11.1 (L) 12.0 - 15.0 g/dL   HCT 09.8 (L) 11.9 - 14.7 %   MCV 87.7 80.0 - 100.0 fL   MCH 28.5 26.0 - 34.0 pg   MCHC 32.6 30.0 - 36.0 g/dL   RDW 82.9 56.2 - 13.0 %   Platelets 190 150 - 400 K/uL   nRBC 0.0 0.0 - 0.2 %   Neutrophils Relative % 59 %   Neutro Abs 4.2 1.7 - 7.7 K/uL   Lymphocytes Relative 26 %   Lymphs Abs 1.8 0.7 - 4.0 K/uL   Monocytes Relative 12 %   Monocytes Absolute 0.9 0.1 - 1.0 K/uL   Eosinophils Relative 2 %   Eosinophils Absolute 0.2 0.0 - 0.5 K/uL   Basophils Relative 1 %   Basophils Absolute 0.1 0.0 - 0.1 K/uL   Immature Granulocytes 0 %   Abs Immature Granulocytes 0.03 0.00 - 0.07 K/uL    Comment: Performed at [facility], [address], [place], [place] [address]  Basic metabolic panel     Status: Abnormal   Collection Time: 04/28/23 12:00 AM  Result Value Ref Range   Sodium 139 135 - 145 mmol/L   Potassium 3.1 (L) 3.5 - 5.1 mmol/L  Chloride 100 98 - 111 mmol/L   CO2 29 22 - 32 mmol/L   Glucose, Bld 103 (H) 70 - 99 mg/dL    Comment: Glucose reference range applies only to samples taken after fasting for at least 8 hours.   BUN 14 8 - 23 mg/dL   Creatinine, Ser 1.61 0.44 - 1.00 mg/dL   Calcium 8.5 (L) 8.9 - 10.3 mg/dL   GFR, Estimated >09 >60 mL/min    Comment: (NOTE) Calculated using the CKD-EPI Creatinine Equation (2021)    Anion gap 10 5 - 15    Comment: Performed at [facility], [address] [place], [place] [address]  CBC with Differential/Platelet     Status: None   Collection Time: 04/28/23  8:00 AM  Result Value Ref Range   WBC 8.1 4.0 - 10.5 K/uL   RBC 4.33 3.87 - 5.11 MIL/uL   Hemoglobin 12.4 12.0 - 15.0 g/dL   HCT 81.1  91.4 - 78.2 %   MCV 91.0 80.0 - 100.0 fL   MCH 28.6 26.0 - 34.0 pg   MCHC 31.5 30.0 - 36.0 g/dL   RDW 95.6 21.3 - 08.6 %   Platelets 221 150 - 400 K/uL   nRBC 0.0 0.0 - 0.2 %   Neutrophils Relative % 68 %   Neutro Abs 5.5 1.7 - 7.7 K/uL   Lymphocytes Relative 19 %   Lymphs Abs 1.5 0.7 - 4.0 K/uL   Monocytes Relative 10 %   Monocytes Absolute 0.8 0.1 - 1.0 K/uL   Eosinophils Relative 2 %   Eosinophils Absolute 0.2 0.0 - 0.5 K/uL   Basophils Relative 1 %   Basophils Absolute 0.1 0.0 - 0.1 K/uL   Immature Granulocytes 0 %   Abs Immature Granulocytes 0.02 0.00 - 0.07 K/uL    Comment: Performed at [facility], [address] [place], [place] [address]  Comprehensive metabolic panel     Status: Abnormal   Collection Time: 04/28/23  8:00 AM  Result Value Ref Range   Sodium 142 135 - 145 mmol/L   Potassium 4.1 3.5 - 5.1 mmol/L   Chloride 106 98 - 111 mmol/L   CO2 25 22 - 32 mmol/L   Glucose, Bld 98 70 - 99 mg/dL    Comment: Glucose reference range applies only to samples taken after fasting for at least 8 hours.   BUN 8 8 - 23 mg/dL   Creatinine, Ser 9.62 0.44 - 1.00 mg/dL   Calcium 8.6 (L) 8.9 - 10.3 mg/dL   Total Protein 6.0 (L) 6.5 - 8.1 g/dL   Albumin 3.0 (L) 3.5 - 5.0 g/dL   AST 33 15 - 41 U/L   ALT 20 0 - 44 U/L   Alkaline Phosphatase 73 38 - 126 U/L   Total Bilirubin 1.6 (H) 0.3 - 1.2 mg/dL   GFR, Estimated >95 >28 mL/min    Comment: (NOTE) Calculated using the CKD-EPI Creatinine Equation (2021)    Anion gap 11 5 - 15    Comment: Performed at [facility], [address] [place], [place] [address]  Magnesium     Status: None   Collection Time: 04/28/23  8:00 AM  Result Value Ref Range   Magnesium 1.8 1.7 - 2.4 mg/dL    Comment: Performed at [facility], [address] [place], [place] [address]  Protime-INR     Status: None   Collection Time: 04/28/23  8:00 AM  Result Value  Ref Range   Prothrombin Time 14.1 11.4 - 15.2 seconds   INR 1.1 0.8 -  1.2    Comment: (NOTE) INR goal varies based on device and disease states. Performed at [facility], [address], [place], [place] [address]   VITAMIN D 25 Hydroxy (Vit-D Deficiency, Fractures)     Status: Abnormal   Collection Time: 04/28/23  8:00 AM  Result Value Ref Range   Vit D, 25-Hydroxy 26.82 (L) 30 - 100 ng/mL    Comment: (NOTE) Vitamin D deficiency has been defined by the Institute of Medicine  and an Endocrine Society practice guideline as a level of serum 25-OH  vitamin D less than 20 ng/mL (1,2). The Endocrine Society went on to  further define vitamin D insufficiency as a level between 21 and 29  ng/mL (2).  1. IOM (Institute of Medicine). 2010. Dietary reference intakes for  calcium and D. [place]: [employer]. 2. Holick MF, Binkley [place], Bischoff-Ferrari HA, et al. Evaluation,  treatment, and prevention of vitamin D deficiency: an Endocrine  Society clinical practice guideline, JCEM. 2011 Jul; 96(7): 1911-30.  Performed at [facility], [address], [place], [place] [address]     DG Chest 1 View  Result Date: 04/27/2023 CLINICAL DATA:  Recent fall with chest pain, initial encounter EXAM: PORTABLE CHEST 1 VIEW COMPARISON:  02/30/2024 FINDINGS: Cardiac shadow is within normal limits. Old granuloma is noted in the right upper lobe. Calcified left breast implant is noted. The lungs are clear. No bony abnormality is noted. IMPRESSION: No acute abnormality noted. Electronically Signed   By: Alcide Clever M.D.   On: 04/27/2023 23:37   DG Pelvis 1-2 Views  Result Date: 04/27/2023 CLINICAL DATA:  Fall EXAM: PELVIS - 1-2 VIEW COMPARISON:  Pelvis x-ray 12/23/2022 FINDINGS: There is a nondisplaced subcapital left femoral neck fracture. No dislocation. Joint spaces are maintained. Soft tissues are within normal limits. IMPRESSION: Nondisplaced subcapital left femoral neck fracture. Electronically Signed   By: Darliss Cheney M.D.   On:  04/27/2023 23:35    Review of Systems  Unable to perform ROS: Dementia   Blood pressure (!) 111/90, pulse 77, temperature 98.4 F (36.9 C), temperature source Axillary, resp. rate 20, height 5\' 4"  (1.626 m), weight 80 kg, SpO2 100 %. Physical Exam Constitutional:      General: She is not in acute distress.    Appearance: She is well-developed. She is not diaphoretic.  HENT:     Head: Normocephalic and atraumatic.  Eyes:     General: No scleral icterus.       Right eye: No discharge.        Left eye: No discharge.     Conjunctiva/sclera: Conjunctivae normal.  Cardiovascular:     Rate and Rhythm: Normal rate and regular rhythm.  Pulmonary:     Effort: Pulmonary effort is normal. No respiratory distress.  Musculoskeletal:     Cervical back: Normal range of motion.     Comments: LLE No traumatic wounds, ecchymosis, or rash  Apparent mild TTP hip  No knee or ankle effusion  Knee stable to varus/ valgus and anterior/posterior stress  Sens DPN, SPN, TN could not assess  Motor EHL, ext, flex, evers could not assess  DP 2+, PT 2+, No significant edema   Skin:    General: Skin is warm and dry.  Neurological:     Mental Status: She is alert.  Psychiatric:  Mood and Affect: Mood normal.        Behavior: Behavior normal.    Assessment/Plan: Left hip fx -- Will get CT of hip, may be able to do cannulated hip pinning, otherwise will need hip hemi. Please keep NPO for now. Multiple medical problems including advanced dementia, hypothyroidism and dyslipidemia -- per primary service    Freeman Caldron, PA-C Orthopedic Surgery [phone] 04/28/2023, 9:45 AM

## 2023-04-28 NOTE — Progress Notes (Addendum)
  Carryover admission to the Day Admitter.  I discussed this case with the EDP, Antony Madura, PA.  Per these discussions:   This is a this is a 77 year old female with dementia, who is being admitted with acute left femoral neck fracture after experiencing a fall at [facility] over the weekend.  All the details of the fall are not entirely clear at this time, the patient reportedly started conveying left hip discomfort after this fall, prompting pursuit of plain films performed at the facility, which revealed acute left femoral neck fracture.  Following these radiographic findings, the patient was brought to [facility] emergency department for further evaluation and management thereof.  She reportedly is not on any blood thinners as an outpatient.  Her systolic blood pressures are in the 90s with heart rates in the 60s.  Afebrile.  Presenting labs notable for potassium 3.1.  EDP d/w Dr. Yevette Edwards of [facility], who conveyed that [facility] will formally consult/see patient in the AM (6/4).   I have placed an order for inpatient admission to med/tele for further evaluation management of the above.  I have placed some additional preliminary admit orders via the adult multi-morbid admission order set. I have also ordered a 1 L normal saline bolus for her systolic blood pressures in the 90s.  Additionally have ordered potassium chloride 40 mill equivalents IV over 4 hours, along with morning labs that include CMP, CBC, serum magnesium level as well as INR.  I have made her n.p.o., and ordered prn IV fentanyl as well as prn IV Zofran.  Also, she has noted to have active DNR paperwork on file.  Consequently, I have set her CODE STATUS to DNR.    Newton Pigg, DO Hospitalist

## 2023-04-28 NOTE — Anesthesia Preprocedure Evaluation (Addendum)
Anesthesia Evaluation  Patient identified by MRN, date of birth, ID band Patient confused    Reviewed: Allergy & Precautions, H&P , NPO status , Patient's Chart, lab work & pertinent test results, Unable to perform ROS - Chart review only  Airway Mallampati: III  TM Distance: >3 FB Neck ROM: Full    Dental  (+) Dental Advisory Given   Pulmonary    Pulmonary exam normal        Cardiovascular Normal cardiovascular exam   Per daughter, no formal diagnosis provided (ie CHF), but on lasix for intermittent pedal edema   Neuro/Psych  PSYCHIATRIC DISORDERS  Depression   Dementia    GI/Hepatic   Endo/Other  Hypothyroidism   Obesity   Renal/GU      Musculoskeletal   Abdominal   Peds  Hematology   Anesthesia Other Findings   Reproductive/Obstetrics                             Anesthesia Physical Anesthesia Plan  ASA: 3  Anesthesia Plan: General   Post-op Pain Management: Minimal or no pain anticipated   Induction: Intravenous  PONV Risk Score and Plan: 3 and Ondansetron, Treatment may vary due to age or medical condition and Propofol infusion  Airway Management Planned: Oral ETT  Additional Equipment: None  Intra-op Plan:   Post-operative Plan: Extubation in OR  Informed Consent: I have reviewed the patients History and Physical, chart, labs and discussed the procedure including the risks, benefits and alternatives for the proposed anesthesia with the patient or authorized representative who has indicated his/her understanding and acceptance.   Patient has DNR.  Discussed DNR with power of attorney.   Dental advisory given and Consent reviewed with POA  Plan Discussed with: CRNA and Anesthesiologist  Anesthesia Plan Comments: (Discussed DNR with daughter at bedside. She is agreeable to intubation, IV fluids, vasopressors, and defibrillation as needed during the procedure. However,  she does not want her mother to receive chest compressions under any circumstance.)       Anesthesia Quick Evaluation

## 2023-04-28 NOTE — Anesthesia Postprocedure Evaluation (Signed)
Anesthesia Post Note  Patient: Brooke Owens  Procedure(s) Performed: CANNULATED HIP PINNING (Left: Hip)     Patient location during evaluation: PACU Anesthesia Type: General Level of consciousness: awake and confused Pain management: pain level controlled Vital Signs Assessment: post-procedure vital signs reviewed and stable Respiratory status: spontaneous breathing, nonlabored ventilation and respiratory function stable Cardiovascular status: stable and blood pressure returned to baseline Anesthetic complications: no   There were no known notable events for this encounter.  Last Vitals:  Vitals:   04/28/23 1800 04/28/23 1815  BP: 121/64 (!) 115/57  Pulse: 76 67  Resp: 20 11  Temp:  36.4 C  SpO2: 99% 100%    Last Pain:  Vitals:   04/28/23 1723  TempSrc:   PainSc: Asleep    LLE Motor Response: Purposeful movement (04/28/23 1815)            Beryle Lathe

## 2023-04-29 ENCOUNTER — Encounter (HOSPITAL_COMMUNITY): Payer: Self-pay | Admitting: Orthopedic Surgery

## 2023-04-29 DIAGNOSIS — S72002D Fracture of unspecified part of neck of left femur, subsequent encounter for closed fracture with routine healing: Secondary | ICD-10-CM | POA: Diagnosis not present

## 2023-04-29 LAB — BASIC METABOLIC PANEL
Anion gap: 10 (ref 5–15)
BUN: 11 mg/dL (ref 8–23)
CO2: 21 mmol/L — ABNORMAL LOW (ref 22–32)
Calcium: 8.6 mg/dL — ABNORMAL LOW (ref 8.9–10.3)
Chloride: 108 mmol/L (ref 98–111)
Creatinine, Ser: 0.78 mg/dL (ref 0.44–1.00)
GFR, Estimated: >60 mL/min (ref >60)
Glucose, Bld: 106 mg/dL — ABNORMAL HIGH (ref 70–99)
Potassium: 4.1 mmol/L (ref 3.5–5.1)
Sodium: 139 mmol/L (ref 135–145)

## 2023-04-29 LAB — CBC
HCT: 34.7 % — ABNORMAL LOW (ref 36.0–46.0)
Hemoglobin: 11 g/dL — ABNORMAL LOW (ref 12.0–15.0)
MCH: 27.9 pg (ref 26.0–34.0)
MCHC: 31.7 g/dL (ref 30.0–36.0)
MCV: 88.1 fL (ref 80.0–100.0)
Platelets: 231 10*3/uL (ref 150–400)
RBC: 3.94 MIL/uL (ref 3.87–5.11)
RDW: 13.3 % (ref 11.5–15.5)
WBC: 10.3 10*3/uL (ref 4.0–10.5)
nRBC: 0 % (ref 0.0–0.2)

## 2023-04-29 MED ORDER — METOCLOPRAMIDE HCL 5 MG/ML IJ SOLN: 5.0000 mg | Freq: Three times a day (TID) | INTRAMUSCULAR | Status: DC | PRN

## 2023-04-29 MED ORDER — ENSURE ENLIVE PO LIQD
237.0000 mL | Freq: Two times a day (BID) | ORAL | Status: DC
  Administered 2023-04-29 – 2023-05-01 (×4): 237 mL via ORAL

## 2023-04-29 MED ORDER — ONDANSETRON HCL 4 MG/2ML IJ SOLN: 4.0000 mg | Freq: Four times a day (QID) | INTRAMUSCULAR | Status: DC | PRN

## 2023-04-29 MED ORDER — ONDANSETRON HCL 4 MG PO TABS: 4.0000 mg | ORAL_TABLET | Freq: Four times a day (QID) | ORAL | Status: DC | PRN

## 2023-04-29 MED ORDER — DOCUSATE SODIUM 100 MG PO CAPS
100.0000 mg | ORAL_CAPSULE | Freq: Two times a day (BID) | ORAL | Status: DC
  Administered 2023-04-29 – 2023-05-01 (×5): 100 mg via ORAL
  Filled 2023-04-29 (×5): qty 1

## 2023-04-29 MED ORDER — ENOXAPARIN SODIUM 40 MG/0.4ML IJ SOSY
40.0000 mg | PREFILLED_SYRINGE | Freq: Every day | INTRAMUSCULAR | Status: DC
  Administered 2023-04-29 – 2023-05-01 (×3): 40 mg via SUBCUTANEOUS
  Filled 2023-04-29 (×3): qty 0.4

## 2023-04-29 MED ORDER — ACETAMINOPHEN 10 MG/ML IV SOLN
1000.0000 mg | Freq: Once | INTRAVENOUS | Status: AC
  Administered 2023-04-29: 1000 mg via INTRAVENOUS
  Filled 2023-04-29: qty 100

## 2023-04-29 MED ORDER — HYDROCODONE-ACETAMINOPHEN 5-325 MG PO TABS
1.0000 | ORAL_TABLET | Freq: Four times a day (QID) | ORAL | Status: DC | PRN
  Administered 2023-04-29 (×2): 1 via ORAL
  Filled 2023-04-29 (×2): qty 1

## 2023-04-29 MED ORDER — CEFAZOLIN SODIUM-DEXTROSE 2-4 GM/100ML-% IV SOLN
2.0000 g | Freq: Four times a day (QID) | INTRAVENOUS | Status: AC
  Administered 2023-04-29 (×2): 2 g via INTRAVENOUS
  Filled 2023-04-29 (×2): qty 100

## 2023-04-29 MED ORDER — ACETAMINOPHEN 500 MG PO TABS
500.0000 mg | ORAL_TABLET | Freq: Four times a day (QID) | ORAL | Status: DC
  Administered 2023-04-29 – 2023-05-01 (×6): 500 mg via ORAL
  Filled 2023-04-29 (×7): qty 1

## 2023-04-29 MED ORDER — ATORVASTATIN CALCIUM 10 MG PO TABS
20.0000 mg | ORAL_TABLET | Freq: Every day | ORAL | Status: DC
  Administered 2023-04-29 – 2023-04-30 (×2): 20 mg via ORAL
  Filled 2023-04-29 (×2): qty 2

## 2023-04-29 MED ORDER — LEVOTHYROXINE SODIUM 88 MCG PO TABS
88.0000 ug | ORAL_TABLET | Freq: Every day | ORAL | Status: DC
  Administered 2023-04-30 – 2023-05-01 (×2): 88 ug via ORAL
  Filled 2023-04-29 (×2): qty 1

## 2023-04-29 MED ORDER — ACETAMINOPHEN 325 MG PO TABS
325.0000 mg | ORAL_TABLET | Freq: Four times a day (QID) | ORAL | Status: DC | PRN
  Administered 2023-04-30: 325 mg via ORAL

## 2023-04-29 MED ORDER — MENTHOL 3 MG MT LOZG: 1.0000 | LOZENGE | OROMUCOSAL | Status: DC | PRN

## 2023-04-29 MED ORDER — PHENOL 1.4 % MT LIQD: 1.0000 | OROMUCOSAL | Status: DC | PRN

## 2023-04-29 MED ORDER — ADULT MULTIVITAMIN W/MINERALS CH
1.0000 | ORAL_TABLET | Freq: Every day | ORAL | Status: DC
  Administered 2023-04-29 – 2023-05-01 (×3): 1 via ORAL
  Filled 2023-04-29 (×3): qty 1

## 2023-04-29 MED ORDER — METOCLOPRAMIDE HCL 5 MG PO TABS: 5.0000 mg | ORAL_TABLET | Freq: Three times a day (TID) | ORAL | Status: DC | PRN

## 2023-04-29 MED ORDER — CHLORHEXIDINE GLUCONATE CLOTH 2 % EX PADS
6.0000 | MEDICATED_PAD | Freq: Every day | CUTANEOUS | Status: DC
  Administered 2023-04-29 – 2023-05-01 (×2): 6 via TOPICAL

## 2023-04-29 MED ORDER — FLUOXETINE HCL 20 MG PO CAPS
20.0000 mg | ORAL_CAPSULE | Freq: Every morning | ORAL | Status: DC
  Administered 2023-04-30 – 2023-05-01 (×2): 20 mg via ORAL
  Filled 2023-04-29 (×2): qty 1

## 2023-04-29 NOTE — Progress Notes (Signed)
SLP Cancellation Note  Patient Details Name: Brooke Owens MRN: [id number] DOB: 10/06/1946   Cancelled treatment:       Reason Eval/Treat Not Completed: Other (comment) New order for swallow evaluation received. SLP completed initial swallow evaluation on 04/28/23. No changes in swallow reported by RN but patient has been requiring assistance, cues, encouragement to eat and take medications. SLP spoke briefly with daughter in room. She stated that patient ate breakfast today and "her swallowing is fine" but that she needs full assistance with feeding whereas she was feeding herself at her [facility] facility. When SLP in room, patient drinking water via straw sips without observed s/s aspiration. Full evaluation not indicated at this time. Patient continues to be safe for regular texture solids, thin liquids as long as she has full supervision and assistance with feeding.  Angela Nevin, MA, CCC-SLP Speech Therapy

## 2023-04-29 NOTE — Progress Notes (Signed)
Initial Nutrition Assessment  DOCUMENTATION CODES:   Not applicable  INTERVENTION:  Continue regular diet as ordered Continue feeding with assistance as needed Ensure Enlive po BID, each supplement provides 350 kcal and 20 grams of protein. Magic cup TID with meals, each supplement provides 290 kcal and 9 grams of protein MVI with minerals daly  NUTRITION DIAGNOSIS:   Increased nutrient needs related to post-op healing, hip fracture as evidenced by estimated needs.  GOAL:   Patient will meet greater than or equal to 90% of their needs  MONITOR:   PO intake, Supplement acceptance, Labs, Weight trends, Skin  REASON FOR ASSESSMENT:   Consult Hip fracture protocol  ASSESSMENT:   Pt admitted from SNF with L hip fracture. PMH significant for advanced dementia, hypothyroidism, HLD.  6/4 - s/p L cannulated hip pinning; BSE by ST- recommend dysphagia 3, thin liquid unless full supervision with meals, can advance to regular texture solid  Oriented x1.   Spoke with pt's daughter present at bedside. She reports that she had not eaten since Monday. Breakfast had been delivered this morning. She tried to assist pt with feeding but only took about 3 bites of french toast. She states that it is typical for her to take a few bites of food and then say she is getting full so portion sizes are not likely to be sufficient.   Pt's daughter states that she has lost weight over the last 4-5 years and has noticed that she appears to continue to lose weight since moving into her SNF about 2 years ago. She gets report that she is eating well but is not certain the quantity that pt is consuming. Believes that they are providing Ensure supplements.  Unfortunately, there is limited weight history on file to review. Current weight is documented to be 80 kg uncertain whether this is actual versus estimated weight. Will continue to monitor throughout admission.   Medications: colace, IV abx  Labs  reviewed   NUTRITION - FOCUSED PHYSICAL EXAM: Pt working with OT, deferred to follow up.   Diet Order:   Diet Order             Diet regular Room service appropriate? Yes; Fluid consistency: Thin  Diet effective now                   EDUCATION NEEDS:   Education needs have been addressed  Skin:  Skin Assessment: Reviewed RN Assessment (closed L hip incision)  Last BM:  unknown  Height:   Ht Readings from Last 1 Encounters:  04/28/23 5\' 4"  (1.626 m)    Weight:   Wt Readings from Last 1 Encounters:  04/28/23 80 kg    Ideal Body Weight:  54.5 kg  BMI:  Body mass index is 30.27 kg/m.  Estimated Nutritional Needs:   Kcal:  1600-1800  Protein:  80-95g  Fluid:  >/=1.6L  Drusilla Kanner, RDN, LDN Clinical Nutrition

## 2023-04-29 NOTE — Plan of Care (Signed)

## 2023-04-29 NOTE — Evaluation (Signed)
Physical Therapy Evaluation Patient Details Name: Brooke Owens MRN: [id number] DOB: 11/20/1946 Today's Date: 04/29/2023  History of Present Illness  77 year old female who was brought from nursing facility 6/3 after she had a fall and started having left hip pain. She is now S/p L cannulated hip pinning 04/27/22. PMH of advanced dementia, hypothyroidism, hyperlipidemia  Clinical Impression  Pt was seen for mobility on RW with help to maneuver and direct walker, noted her ability to self direct walker is difficult as the walker is unfamiliar.  WIll as for <3 hours a day rehab to progress her mobility and strength as tolerated upon readmission to her SNF care location, and will work on resuming her mobility in acute care as goals are written.  Progress as tolerated.       Recommendations for follow up therapy are one component of a multi-disciplinary discharge planning process, led by the attending physician.  Recommendations may be updated based on patient status, additional functional criteria and insurance authorization.  Follow Up Recommendations Can patient physically be transported by private vehicle: No     Assistance Recommended at Discharge Frequent or constant Supervision/Assistance  Patient can return home with the following  A lot of help with walking and/or transfers;A little help with bathing/dressing/bathroom;Assistance with cooking/housework;Assist for transportation;Help with stairs or ramp for entrance    Equipment Recommendations None recommended by PT  Recommendations for Other Services       Functional Status Assessment Patient has had a recent decline in their functional status and demonstrates the ability to make significant improvements in function in a reasonable and predictable amount of time.     Precautions / Restrictions Precautions Precautions: Fall Precaution Comments: watch BP Restrictions Weight Bearing Restrictions: Yes LLE Weight Bearing: Weight bearing  as tolerated      Mobility  Bed Mobility Overal bed mobility: Needs Assistance             General bed mobility comments: up in recliner when PT arrived    Transfers Overall transfer level: Needs assistance Equipment used: Rolling walker (2 wheels) Transfers: Sit to/from Stand Sit to Stand: Min assist           General transfer comment: instructed hand placement on chair    Ambulation/Gait Ambulation/Gait assistance: Min assist Gait Distance (Feet): 15 Feet Assistive device: Rolling walker (2 wheels) Gait Pattern/deviations: Step-through pattern, Decreased stance time - left Gait velocity: reduced Gait velocity interpretation: <1.31 ft/sec, indicative of household ambulator Pre-gait activities: standing balance General Gait Details: pt is walking short trips with RW and dense cues for sequence and direction, obviously unfamiliar  Stairs            Wheelchair Mobility    Modified Rankin (Stroke Patients Only)       Balance Overall balance assessment: Needs assistance Sitting-balance support: Feet supported Sitting balance-Leahy Scale: Fair     Standing balance support: Bilateral upper extremity supported, During functional activity Standing balance-Leahy Scale: Poor Standing balance comment: RW with cues for posture                             Pertinent Vitals/Pain Pain Assessment Pain Assessment: Faces Faces Pain Scale: No hurt Pain Intervention(s): Monitored during session    Home Living Family/patient expects to be discharged to:: Skilled nursing facility                   Additional Comments: resident of facility memory unit, fall history  Prior Function Prior Level of Function : Needs assist;History of Falls (last six months)       Physical Assist : Mobility (physical) Mobility (physical): Gait   Mobility Comments: HHA in facility was used but not AD       Hand Dominance   Dominant Hand: Right     Extremity/Trunk Assessment   Upper Extremity Assessment Upper Extremity Assessment: Defer to OT evaluation    Lower Extremity Assessment Lower Extremity Assessment: Generalized weakness;LLE deficits/detail LLE Deficits / Details: weakness and dependent on RLE to stand up LLE Coordination: decreased gross motor    Cervical / Trunk Assessment Cervical / Trunk Assessment: Kyphotic  Communication   Communication: Other (comment) (confusion and difficulty with maintaining attention to communication)  Cognition Arousal/Alertness: Awake/alert Behavior During Therapy: WFL for tasks assessed/performed Overall Cognitive Status: History of cognitive impairments - at baseline                                          General Comments General comments (skin integrity, edema, etc.): Pt is up to stand from recliner to get to bed with short walk and noted BP in functional range with no complaints of light headed feeling    Exercises     Assessment/Plan    PT Assessment Patient needs continued PT services  PT Problem List Decreased strength;Decreased activity tolerance;Decreased balance;Decreased mobility;Decreased coordination;Decreased cognition;Decreased knowledge of use of DME;Decreased safety awareness       PT Treatment Interventions DME instruction;Gait training;Functional mobility training;Therapeutic activities;Therapeutic exercise;Balance training;Neuromuscular re-education;Patient/family education    PT Goals (Current goals can be found in the Care Plan section)  Acute Rehab PT Goals Patient Stated Goal: none stated Time For Goal Achievement: 05/13/23 Potential to Achieve Goals: Good    Frequency Min 3X/week     Co-evaluation               AM-PAC PT "6 Clicks" Mobility  Outcome Measure Help needed turning from your back to your side while in a flat bed without using bedrails?: A Little Help needed moving from lying on your back to sitting on the  side of a flat bed without using bedrails?: A Little Help needed moving to and from a bed to a chair (including a wheelchair)?: A Little Help needed standing up from a chair using your arms (e.g., wheelchair or bedside chair)?: A Little Help needed to walk in hospital room?: A Lot Help needed climbing 3-5 steps with a railing? : Total 6 Click Score: 15    End of Session Equipment Utilized During Treatment: Gait belt Activity Tolerance: Patient tolerated treatment well;Patient limited by fatigue;Treatment limited secondary to medical complications (Comment) Patient left: in bed;with call bell/phone within reach;with bed alarm set Nurse Communication: Mobility status PT Visit Diagnosis: Unsteadiness on feet (R26.81);Other abnormalities of gait and mobility (R26.89);Repeated falls (R29.6);Muscle weakness (generalized) (M62.81);Difficulty in walking, not elsewhere classified (R26.2)    Time: 1610-9604 PT Time Calculation (min) (ACUTE ONLY): 18 min   Charges:   PT Evaluation $PT Eval Moderate Complexity: 1 Mod         Ivar Drape 04/29/2023, 5:54 PM  Samul Dada, PT PhD Acute Rehab Dept. Number: [facility] [id number] and [facility] [phone]

## 2023-04-29 NOTE — Progress Notes (Signed)
   Brooke Owens  ZOX:[id number] DOB: 12-21-1945 DOA: 04/27/2023 PCP: Shon Hale, MD    Brief Narrative:  77 year old SNF resident with a history of advanced dementia, hypothyroidism, and HLD who was transported to the ER from her facility following a fall with resultant left hip pain.  Workup revealed a nondisplaced subcapital left femoral neck fracture.  Consultants:  Orthopedic Surgery  Goals of Care:  Code Status: DNR   DVT prophylaxis: Lovenox  Interim Hx: Afebrile.  Vital signs stable.  Assessment & Plan:  Acute left femoral neck fracture status post mechanical fall Care per orthopedic surgery  Acute hypokalemia Corrected with simple replacement  Dehydration - modest hypotension Continue low volume volume resuscitation  Advanced dementia Continue usual Aricept  Hypothyroidism Continue usual levothyroxine  HLD Continue usual Lipitor  Family Communication: Spoke with daughter at bedside Disposition: Anticipate discharge to rehab facility when cleared by orthopedics   Objective: Blood pressure (!) 99/32, pulse 61, temperature 98.5 F (36.9 C), temperature source Oral, resp. rate 17, height 5\' 4"  (1.626 m), weight 80 kg, SpO2 97 %.  Intake/Output Summary (Last 24 hours) at 04/29/2023 1115 Last data filed at 04/29/2023 0900 Gross per 24 hour  Intake 2233.71 ml  Output 180 ml  Net 2053.71 ml   Filed Weights   04/27/23 2226 04/28/23 1513  Weight: 80 kg 80 kg    Examination: General: No acute respiratory distress Lungs: Clear to auscultation bilaterally without wheezes or crackles Cardiovascular: Regular rate and rhythm without murmur gallop or rub normal S1 and S2 Abdomen: Nontender, nondistended, soft, bowel sounds positive, no rebound, no ascites, no appreciable mass Extremities: No significant cyanosis, clubbing, or edema bilateral lower extremities  CBC: Recent Labs  Lab 04/28/23 0000 04/28/23 0800 04/29/23 0929  WBC 7.1 8.1 10.3   NEUTROABS 4.2 5.5  --   HGB 11.1* 12.4 11.0*  HCT 34.1* 39.4 34.7*  MCV 87.7 91.0 88.1  PLT 190 221 231   Basic Metabolic Panel: Recent Labs  Lab 04/28/23 0000 04/28/23 0800 04/29/23 0929  NA 139 142 139  K 3.1* 4.1 4.1  CL 100 106 108  CO2 29 25 21*  GLUCOSE 103* 98 106*  BUN 14 8 11   CREATININE 0.85 0.77 0.78  CALCIUM 8.5* 8.6* 8.6*  MG  --  1.8  --    GFR: Estimated Creatinine Clearance: 60.2 mL/min (by C-G formula based on SCr of 0.78 mg/dL).   Scheduled Meds:  acetaminophen  500 mg Oral Q6H   aspirin EC  325 mg Oral Daily   Chlorhexidine Gluconate Cloth  6 each Topical Daily   docusate sodium  100 mg Oral BID   enoxaparin (LOVENOX) injection  40 mg Subcutaneous Daily   sodium chloride flush  3 mL Intravenous Q12H   Continuous Infusions:  0.9 % NaCl with KCl 20 mEq / L 75 mL/hr at 04/28/23 2048    ceFAZolin (ANCEF) IV 2 g (04/29/23 0508)     LOS: 1 day   Lonia Blood, MD [facility] Office  [phone] Pager - Text Page per Loretha Stapler  If 7PM-7AM, please contact night-coverage per Amion 04/29/2023, 11:15 AM

## 2023-04-29 NOTE — Evaluation (Signed)
Occupational Therapy Evaluation Patient Details Name: Brooke Owens MRN: [id number] DOB: 08-22-1946 Today's Date: 04/29/2023   History of Present Illness 77 year old female who was brought from nursing facility after she had a fall and started having left hip pain. She is now S/p L cannulated hip pinning 04/25/22. PMH of advanced dementia, hypothyroidism, hyperlipidemia   Clinical Impression   Pt admitted for above dx, PTA patient resided at memory care unit and ambulated no AD, ind in bathing but had assist with other bADLs. Pt has had 4 falls in the last year, daughter present and hoping patient return to SNF. Pt currently presenting with impaired balance, needing min A HHA +2 to safely ambulate, she has decreased knowledge of safe RW use despite cueing. Pt  would benefit from continued acute skilled OT services to address above deficits and help transition to next level of care. Patient would benefit from post acute skilled rehab facility with <3 hours of therapy and 24/7 support      Recommendations for follow up therapy are one component of a multi-disciplinary discharge planning process, led by the attending physician.  Recommendations may be updated based on patient status, additional functional criteria and insurance authorization.   Assistance Recommended at Discharge Frequent or constant Supervision/Assistance  Patient can return home with the following A little help with walking and/or transfers;A little help with bathing/dressing/bathroom;Direct supervision/assist for medications management;Direct supervision/assist for financial management;Assistance with cooking/housework    Functional Status Assessment  Patient has had a recent decline in their functional status and demonstrates the ability to make significant improvements in function in a reasonable and predictable amount of time.  Equipment Recommendations  None recommended by OT (defer to next level of care)    Recommendations  for Other Services       Precautions / Restrictions Precautions Precautions: Fall Restrictions Weight Bearing Restrictions: Yes LLE Weight Bearing: Weight bearing as tolerated      Mobility Bed Mobility Overal bed mobility: Needs Assistance Bed Mobility: Supine to Sit     Supine to sit: Mod assist     General bed mobility comments: cues to sequence mobility, pt left in recliner    Transfers Overall transfer level: Needs assistance Equipment used: 2 person hand held assist Transfers: Sit to/from Stand Sit to Stand: Min assist           General transfer comment: cues for hand placement      Balance Overall balance assessment: Needs assistance Sitting-balance support: Feet supported Sitting balance-Leahy Scale: Fair     Standing balance support: Bilateral upper extremity supported Standing balance-Leahy Scale: Poor Standing balance comment: reliant on external support for dynamic balance, can stand statically but balance is unsteady                           ADL either performed or assessed with clinical judgement   ADL Overall ADL's : Needs assistance/impaired Eating/Feeding: Sitting;Supervision/ safety;Set up   Grooming: Sitting;Min guard   Upper Body Bathing: Sitting;Supervision/ safety;Set up;Cueing for safety;Cueing for sequencing   Lower Body Bathing: Sitting/lateral leans;Minimal assistance   Upper Body Dressing : Sitting;Cueing for sequencing;Min guard   Lower Body Dressing: Minimal assistance;Sitting/lateral leans;Cueing for sequencing;Cueing for safety   Toilet Transfer: Minimal assistance;+2 for physical assistance;Ambulation   Toileting- Clothing Manipulation and Hygiene: Sitting/lateral lean;Minimal assistance   Tub/ Shower Transfer: Minimal assistance;Ambulation;+2 for physical assistance   Functional mobility during ADLs: +2 for physical assistance;Minimal assistance General ADL Comments: attempted RW, pt  with difficulty  understanding hand placement. Did better with HHA     Pertinent Vitals/Pain Pain Assessment Pain Assessment: Faces Faces Pain Scale: Hurts a little bit Pain Location: L hip Pain Descriptors / Indicators: Aching, Discomfort Pain Intervention(s): Repositioned, Monitored during session     Hand Dominance     Extremity/Trunk Assessment Upper Extremity Assessment Upper Extremity Assessment: Generalized weakness   Lower Extremity Assessment Lower Extremity Assessment: Generalized weakness       Communication Communication Communication: Other (comment) (tangenital speech)   Cognition Arousal/Alertness: Awake/alert Behavior During Therapy: WFL for tasks assessed/performed Overall Cognitive Status: History of cognitive impairments - at baseline                                 General Comments: Pt follows simple step commands inconsistently and with increased time/cueing.     General Comments  VSS on RA, daughter present during OT session            Home Living Family/patient expects to be discharged to:: Skilled nursing facility                                 Additional Comments: Pt residing at memory care unit PTA. Four falls in the last year      Prior Functioning/Environment Prior Level of Function : Needs assist;History of Falls (last six months)             Mobility Comments: ambulated no AD, required more hand held assist after recent fall ADLs Comments: Ind in bathing, had assist with other bADLs from care unit        OT Problem List: Decreased strength;Impaired balance (sitting and/or standing)      OT Treatment/Interventions: Patient/family education;Balance training;Self-care/ADL training;Therapeutic exercise    OT Goals(Current goals can be found in the care plan section) Acute Rehab OT Goals Patient Stated Goal: To get better OT Goal Formulation: With patient/family Time For Goal Achievement: 05/13/23 ADL Goals Pt  Will Perform Grooming: with min guard assist;standing Pt Will Perform Lower Body Bathing: with min guard assist;with set-up;sitting/lateral leans Pt Will Perform Lower Body Dressing: sitting/lateral leans;with set-up;with min guard assist Pt Will Transfer to Toilet: with min guard assist;ambulating  OT Frequency: Min 2X/week    Co-evaluation              AM-PAC OT "6 Clicks" Daily Activity     Outcome Measure Help from another person eating meals?: A Little Help from another person taking care of personal grooming?: A Little Help from another person toileting, which includes using toliet, bedpan, or urinal?: A Little Help from another person bathing (including washing, rinsing, drying)?: A Little Help from another person to put on and taking off regular upper body clothing?: A Little Help from another person to put on and taking off regular lower body clothing?: A Little 6 Click Score: 18   End of Session Equipment Utilized During Treatment: Gait belt Nurse Communication: Mobility status  Activity Tolerance: Patient tolerated treatment well Patient left: in chair;with call bell/phone within reach;with family/visitor present;with chair alarm set  OT Visit Diagnosis: Unsteadiness on feet (R26.81);Other abnormalities of gait and mobility (R26.89);Muscle weakness (generalized) (M62.81)                Time: 1610-9604 OT Time Calculation (min): 37 min Charges:  OT General Charges $OT Visit: 1 Visit OT Evaluation $  OT Eval Moderate Complexity: 1 Mod OT Treatments $Therapeutic Activity: 8-22 mins  04/29/2023  AB, OTR/L  Acute Rehabilitation Services  Office: [phone]   Tristan Schroeder 04/29/2023, 12:10 PM

## 2023-04-30 DIAGNOSIS — S72002D Fracture of unspecified part of neck of left femur, subsequent encounter for closed fracture with routine healing: Secondary | ICD-10-CM | POA: Diagnosis not present

## 2023-04-30 LAB — CBC
HCT: 34.4 % — ABNORMAL LOW (ref 36.0–46.0)
Hemoglobin: 10.9 g/dL — ABNORMAL LOW (ref 12.0–15.0)
MCH: 28.5 pg (ref 26.0–34.0)
MCHC: 31.7 g/dL (ref 30.0–36.0)
MCV: 89.8 fL (ref 80.0–100.0)
Platelets: 201 10*3/uL (ref 150–400)
RBC: 3.83 MIL/uL — ABNORMAL LOW (ref 3.87–5.11)
RDW: 13.5 % (ref 11.5–15.5)
WBC: 6.6 10*3/uL (ref 4.0–10.5)
nRBC: 0 % (ref 0.0–0.2)

## 2023-04-30 LAB — BASIC METABOLIC PANEL
Anion gap: 5 (ref 5–15)
BUN: 15 mg/dL (ref 8–23)
CO2: 24 mmol/L (ref 22–32)
Calcium: 8.4 mg/dL — ABNORMAL LOW (ref 8.9–10.3)
Chloride: 108 mmol/L (ref 98–111)
Creatinine, Ser: 0.79 mg/dL (ref 0.44–1.00)
GFR, Estimated: >60 mL/min (ref >60)
Glucose, Bld: 97 mg/dL (ref 70–99)
Potassium: 3.9 mmol/L (ref 3.5–5.1)
Sodium: 137 mmol/L (ref 135–145)

## 2023-04-30 NOTE — Progress Notes (Signed)
RE:  Brooke Owens       Date of Birth: [birth year]      Date:   04/30/23       To Whom It May Concern:  Please be advised that the above-named patient will require a short-term nursing home stay - anticipated 30 days or less for rehabilitation and strengthening.  The plan is for return home.                 MD signature                Date

## 2023-04-30 NOTE — Progress Notes (Signed)
RE:  Brooke Owens       Date of Birth: Dec 25, 2045      Date:  04/30/23        To Whom It May Concern:  Please be advised that the above-named patient has a primary diagnosis of dementia which supersedes any psychiatric diagnosis.                 MD signature                Date

## 2023-04-30 NOTE — TOC Initial Note (Addendum)
Transition of Care [facility]) - Initial/Assessment Note    Patient Details  Name: Brooke Owens MRN: [id number] Date of Birth: 11-04-1946  [facility] CM/SW Contact:    Lorri Frederick, LCSW Phone Number: 04/30/2023, 11:02 AM  Clinical Narrative:     Pt oriented x1, CSW spoke with daughter [name] in room.  Pt from [facility] memory care unit.  PT recommendations for SNF discussed, daughter does want SNF, medicare choice document provided, permission given to send out referral in hub.  Additional info required for pASSR.           1450: Bed offer provided to daughter [name].  She is requesting response from [place] [facility], reached out to the facility.      Expected Discharge Plan: Skilled Nursing Facility Barriers to Discharge: Continued Medical Work up, SNF Pending bed offer   Patient Goals and CMS Choice   CMS Medicare.gov Compare Post Acute Care list provided to:: Patient Represenative (must comment) (daughter [name]) Choice offered to / list presented to : Adult Children      Expected Discharge Plan and Services In-house Referral: Clinical Social Work   Post Acute Care Choice: Skilled Nursing Facility Living arrangements for the past 2 months: Assisted Living Facility [facility] memory care)                                      Prior Living Arrangements/Services Living arrangements for the past 2 months: Assisted Living Facility [facility] memory [facility]) Lives with:: Facility Resident Patient language and need for interpreter reviewed:: Yes        Need for Family Participation in Patient Care: Yes (Comment) Care giver support system in place?: Yes (comment) Current home services: Other (comment) (none) Criminal Activity/Legal Involvement Pertinent to Current Situation/Hospitalization: No - Comment as needed  Activities of Daily Living      Permission Sought/Granted                  Emotional Assessment Appearance:: Appears stated  age Attitude/Demeanor/Rapport: Unable to Assess Affect (typically observed): Unable to Assess Orientation: : Oriented to Self      Admission diagnosis:  Closed left hip fracture (HCC) [S72.002A] Closed fracture of neck of left femur, initial encounter [facility]) [S72.002A] Patient Active Problem List   Diagnosis Date Noted   Closed left hip fracture (HCC) 04/28/2023   Hypothyroidism 04/21/2019   Hyperlipidemia 04/21/2019   Insomnia 04/21/2019   Depression, 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

## 2023-04-30 NOTE — Progress Notes (Signed)
Brooke Owens  WJX:[id number] DOB: [birth year] DOA: 04/27/2023 PCP: Christene Lye, FNP    Brief Narrative:  77 year old SNF resident with a history of advanced dementia, hypothyroidism, and HLD who was transported to the ER from her facility following a fall with resultant left hip pain.  Workup revealed a nondisplaced subcapital left femoral neck fracture.  Consultants:  Orthopedic Surgery  Goals of Care:  Code Status: DNR   DVT prophylaxis: Lovenox  Interim Hx: No acute events recorded overnight.  Afebrile.  Vital signs stable.  Resting comfortably in bed at the time of my visit.  No evidence of uncontrolled pain or distress.  Is conversant and pleasant.  She  Assessment & Plan:  Acute left femoral neck fracture status post mechanical fall Care per Orthopedic Surgery -Will need SNF rehab stay  Acute hypokalemia Corrected with simple replacement -potassium stable in normal range  Dehydration - modest hypotension Resolved with volume resuscitation  Advanced dementia Continue usual Aricept  Hypothyroidism Continue usual levothyroxine  HLD Continue usual Lipitor  Family Communication:  Disposition: Anticipate discharge to SNF she rehab facility when cleared by Orthopedic Surgery   Objective: Blood pressure (!) 129/55, pulse 66, temperature 97.9 F (36.6 C), temperature source Oral, resp. rate 17, height 5\' 4"  (1.626 m), weight 80 kg, SpO2 91 %.  Intake/Output Summary (Last 24 hours) at 04/30/2023 1002 Last data filed at 04/30/2023 0730 Gross per 24 hour  Intake 1667.34 ml  Output 455 ml  Net 1212.34 ml    Filed Weights   04/27/23 2226 04/28/23 1513  Weight: 80 kg 80 kg    Examination: General: No acute respiratory distress Lungs: Clear to auscultation bilaterally  Cardiovascular: Regular rate and rhythm without murmur gallop or rub normal S1 and S2 Abdomen: Nontender, nondistended, soft, bowel sounds positive, no rebound, no ascites, no appreciable  mass Extremities: No significant edema B LE   CBC: Recent Labs  Lab 04/28/23 0000 04/28/23 0800 04/29/23 0929 04/30/23 0458  WBC 7.1 8.1 10.3 6.6  NEUTROABS 4.2 5.5  --   --   HGB 11.1* 12.4 11.0* 10.9*  HCT 34.1* 39.4 34.7* 34.4*  MCV 87.7 91.0 88.1 89.8  PLT 190 221 231 201    Basic Metabolic Panel: Recent Labs  Lab 04/28/23 0800 04/29/23 0929 04/30/23 0458  NA 142 139 137  K 4.1 4.1 3.9  CL 106 108 108  CO2 25 21* 24  GLUCOSE 98 106* 97  BUN 8 11 15   CREATININE 0.77 0.78 0.79  CALCIUM 8.6* 8.6* 8.4*  MG 1.8  --   --     GFR: Estimated Creatinine Clearance: 60.2 mL/min (by C-G formula based on SCr of 0.79 mg/dL).   Scheduled Meds:  acetaminophen  500 mg Oral Q6H   aspirin EC  325 mg Oral Daily   atorvastatin  20 mg Oral QHS   Chlorhexidine Gluconate Cloth  6 each Topical Daily   docusate sodium  100 mg Oral BID   enoxaparin (LOVENOX) injection  40 mg Subcutaneous Daily   feeding supplement  237 mL Oral BID BM   FLUoxetine  20 mg Oral q AM   levothyroxine  88 mcg Oral QAC breakfast   multivitamin with minerals  1 tablet Oral Daily   sodium chloride flush  3 mL Intravenous Q12H   Continuous Infusions:  0.9 % NaCl with KCl 20 mEq / L 50 mL/hr at 04/30/23 0349     LOS: 2 days   Lonia Blood, MD [facility] Office  [phone] Pager - Text Page per Loretha Stapler  If 7PM-7AM, please contact night-coverage per Amion 04/30/2023, 10:02 AM

## 2023-04-30 NOTE — NC FL2 (Signed)
MEDICAID [id number] LEVEL OF CARE FORM     IDENTIFICATION  Patient Name: Brooke Owens Birthdate: [birth year] Sex: female Admission Date (Current Location): 04/27/2023  [facility] and [place] Number:  Producer, television/film/video and Address:  [facility], [address] [place], [place] [address]      Provider Number: [id number]  Attending Physician Name and Address:  Lonia Blood, MD  Relative Name and Phone Number:  [name] Daughter [phone]    Current Level of Care: Hospital Recommended Level of Care: Skilled Nursing Facility Prior Approval Number:    Date Approved/Denied:   PASRR Number:    Discharge Plan: SNF    Current Diagnoses: Patient Active Problem List   Diagnosis Date Noted   Closed left hip fracture (HCC) 04/28/2023   Hypothyroidism 04/21/2019   Hyperlipidemia 04/21/2019   Insomnia 04/21/2019   Depression, recurrent (HCC) 04/21/2019    Orientation RESPIRATION BLADDER Height & Weight     Self  Normal Continent Weight: 176 lb 5.9 oz (80 kg) Height:  5\' 4"  (162.6 cm)  BEHAVIORAL SYMPTOMS/MOOD NEUROLOGICAL BOWEL NUTRITION STATUS      Continent Diet (see discharge summary)  AMBULATORY STATUS COMMUNICATION OF NEEDS Skin   Limited Assist Verbally Surgical wounds                       Personal Care Assistance Level of Assistance  Bathing, Feeding, Dressing Bathing Assistance: Limited assistance Feeding assistance: Limited assistance Dressing Assistance: Limited assistance     Functional Limitations Info  Sight, Hearing, Speech Sight Info: Adequate Hearing Info: Impaired Speech Info: Adequate    SPECIAL CARE FACTORS FREQUENCY  PT (By licensed PT), OT (By licensed OT)     PT Frequency: 5x week OT Frequency: 5x week            Contractures Contractures Info: Not present    Additional Factors Info  Code Status, Allergies Code Status Info: DNR Allergies Info: Meperidine Hcl           Current  Medications (04/30/2023):  This is the current hospital active medication list Current Facility-Administered Medications  Medication Dose Route Frequency Provider Last Rate Last Admin   acetaminophen (TYLENOL) tablet 325-650 mg  325-650 mg Oral Q6H PRN Montez Morita, PA-C       acetaminophen (TYLENOL) tablet 500 mg  500 mg Oral Q6H Montez Morita, PA-C   500 mg at 04/30/23 0550   aspirin EC tablet 325 mg  325 mg Oral Daily Montez Morita, PA-C   325 mg at 04/30/23 0756   atorvastatin (LIPITOR) tablet 20 mg  20 mg Oral QHS Lonia Blood, MD   20 mg at 04/29/23 2041   Chlorhexidine Gluconate Cloth 2 % PADS 6 each  6 each Topical Daily Lonia Blood, MD   6 each at 04/29/23 0943   docusate sodium (COLACE) capsule 100 mg  100 mg Oral BID Montez Morita, PA-C   100 mg at 04/30/23 0756   enoxaparin (LOVENOX) injection 40 mg  40 mg Subcutaneous Daily Montez Morita, PA-C   40 mg at 04/30/23 0757   feeding supplement (ENSURE ENLIVE / ENSURE PLUS) liquid 237 mL  237 mL Oral BID BM Lonia Blood, MD   237 mL at 04/30/23 0758   FLUoxetine (PROZAC) capsule 20 mg  20 mg Oral q AM Lonia Blood, MD   20 mg at 04/30/23 0756   HYDROcodone-acetaminophen (NORCO/VICODIN) 5-325 MG per tablet 1 tablet  1 tablet Oral Q6H PRN Montez Morita, PA-C   1 tablet at 04/29/23 2041   levothyroxine (SYNTHROID) tablet 88 mcg  88 mcg Oral QAC breakfast Lonia Blood, MD   88 mcg at 04/30/23 0754   melatonin tablet 3 mg  3 mg Oral QHS PRN Montez Morita, PA-C       menthol-cetylpyridinium (CEPACOL) lozenge 3 mg  1 lozenge Oral PRN Montez Morita, PA-C       Or   phenol (CHLORASEPTIC) mouth spray 1 spray  1 spray Mouth/Throat PRN Montez Morita, PA-C       morphine (PF) 2 MG/ML injection 0.5 mg  0.5 mg Intravenous Q2H PRN Montez Morita, PA-C   0.5 mg at 04/29/23 0115   multivitamin with minerals tablet 1 tablet  1 tablet Oral Daily Lonia Blood, MD   1 tablet at 04/30/23 0756   naloxone [facility]) injection 0.4 mg  0.4 mg  Intravenous PRN Montez Morita, PA-C       ondansetron [facility]) injection 4 mg  4 mg Intravenous Q6H PRN Montez Morita, PA-C       ondansetron [facility]) tablet 4 mg  4 mg Oral Q6H PRN Montez Morita, PA-C       Or   ondansetron [facility]) injection 4 mg  4 mg Intravenous Q6H PRN Montez Morita, PA-C       sodium chloride flush (NS) 0.9 % injection 3 mL  3 mL Intravenous Q12H Montez Morita, PA-C   3 mL at 04/30/23 1610     Discharge Medications: Please see discharge summary for a list of discharge medications.  Relevant Imaging Results:  Relevant Lab Results:   Additional Information SSN: [id number]. From [facility] memory care, will return at DC.  Lorri Frederick, LCSW

## 2023-04-30 NOTE — Plan of Care (Signed)

## 2023-05-01 DIAGNOSIS — S72002D Fracture of unspecified part of neck of left femur, subsequent encounter for closed fracture with routine healing: Secondary | ICD-10-CM | POA: Diagnosis not present

## 2023-05-01 MED ORDER — HYDROCODONE-ACETAMINOPHEN 5-325 MG PO TABS: 1.0000 | ORAL_TABLET | Freq: Four times a day (QID) | ORAL | 0 refills | Status: DC | PRN

## 2023-05-01 MED ORDER — MELATONIN 3 MG PO TABS: 3.0000 mg | ORAL_TABLET | Freq: Every evening | ORAL | 0 refills | Status: DC | PRN

## 2023-05-01 MED ORDER — ACETAMINOPHEN 325 MG PO TABS: 325.0000 mg | ORAL_TABLET | Freq: Four times a day (QID) | ORAL | Status: DC | PRN

## 2023-05-01 MED ORDER — ENOXAPARIN SODIUM 40 MG/0.4ML IJ SOSY: 40.0000 mg | PREFILLED_SYRINGE | Freq: Every day | INTRAMUSCULAR | Status: DC

## 2023-05-01 MED ORDER — ASPIRIN 325 MG PO TBEC: 325.0000 mg | DELAYED_RELEASE_TABLET | Freq: Every day | ORAL | Status: DC

## 2023-05-01 NOTE — Plan of Care (Signed)

## 2023-05-01 NOTE — Discharge Summary (Addendum)
DISCHARGE SUMMARY  Brooke Owens  MR#: [id number]  DOB:1946/01/29  Date of Admission: 04/27/2023 Date of Discharge: 05/01/2023  Attending Physician:Cormick Moss Silvestre Gunner, MD  Patient's WUJ:[id number], Toniann Fail, FNP  Consults: Orthopedic Surgery - Dr. Carola Frost   Disposition: D/C to SNF for rehab   Follow-up Appts:  Contact information for follow-up providers     Christene Lye, FNP Follow up in 1 week(s).   Specialty: Family Medicine Contact information: [address] [place] [address] [phone]         Myrene Galas, MD Follow up in 2 week(s).   Specialty: Orthopedic Surgery Contact information: [address] [place] [address] [phone]              Contact information for after-discharge care     Destination     [facility] Preferred SNF .   Service: Skilled Nursing Contact information: [address] [place] [address] [phone]                     Discharge Diagnoses: Acute left femoral neck fracture status post mechanical fall Acute hypokalemia Dehydration - modest hypotension Advanced dementia Hypothyroidism HLD  Initial presentation: 77 year old SNF resident with a history of advanced dementia, hypothyroidism, and HLD who was transported to the ER from her facility following a fall with resultant left hip pain. Workup revealed a nondisplaced subcapital left femoral neck fracture.   Hospital Course:  Acute left femoral neck fracture status post mechanical fall Care per Orthopedic Surgery - s/p IMN in OR 6/4 per Dr. Carola Frost - will need SNF rehab stay - to f/u w/ Dr. Carola Frost in 2 weeks for wound check and post-op evaluation - to utilize lovenox for 21 days as DVT prophylaxis   Acute hypokalemia Corrected with simple replacement - potassium stable in normal range at time of d/c    Dehydration - modest hypotension Stable after volume resuscitation   Advanced dementia No change in  baseline status during this admission    Hypothyroidism Continue usual levothyroxine   HLD Continue usual Lipitor  Allergies as of 05/01/2023       Reactions   Meperidine Hcl Nausea And Vomiting, Other (See Comments)   DEMEROL        Medication List     STOP taking these medications    donepezil 10 MG tablet Commonly known as: ARICEPT   furosemide 20 MG tablet Commonly known as: LASIX   furosemide 40 MG tablet Commonly known as: LASIX   LORazepam 2 MG tablet Commonly known as: ATIVAN   potassium chloride SA 20 MEQ tablet Commonly known as: KLOR-CON M   traZODone 50 MG tablet Commonly known as: DESYREL       TAKE these medications    acetaminophen 325 MG tablet Commonly known as: TYLENOL Take 1-2 tablets (325-650 mg total) by mouth every 6 (six) hours as needed for mild pain (pain score 1-3 or temp > 100.5).   atorvastatin 20 MG tablet Commonly known as: LIPITOR Take 1 tablet (20 mg total) by mouth every other day. What changed: when to take this   enoxaparin 40 MG/0.4ML injection Commonly known as: LOVENOX Inject 0.4 mLs (40 mg total) into the skin daily for 21 days. Start taking on: May 02, 2023   FLUoxetine 20 MG capsule Commonly known as: PROZAC Take 20 mg by mouth daily.   HYDROcodone-acetaminophen 5-325 MG tablet Commonly known as: NORCO/VICODIN Take 1-2 tablets by mouth every 6 (  six) hours as needed for severe pain.   levothyroxine 88 MCG tablet Commonly known as: SYNTHROID Take 88 mcg by mouth daily before breakfast.   melatonin 3 MG Tabs tablet Take 1 tablet (3 mg total) by mouth at bedtime as needed (insomnia).        Day of Discharge BP (!) 94/50 (BP Location: Right Arm)   Pulse 70   Temp 98.5 F (36.9 C) (Oral)   Resp 18   Ht 5\' 4"  (1.626 m)   Wt 80 kg   SpO2 98%   BMI 30.27 kg/m   Physical Exam: General: No acute respiratory distress Lungs: Clear to auscultation bilaterally without wheezes or  crackles Cardiovascular: Regular rate and rhythm without murmur gallop or rub normal S1 and S2 Abdomen: Nontender, nondistended, soft, bowel sounds positive, no rebound, no ascites, no appreciable mass Extremities: No significant cyanosis, clubbing, or edema bilateral lower extremities  Basic Metabolic Panel: Recent Labs  Lab 04/28/23 0000 04/28/23 0800 04/29/23 0929 04/30/23 0458  NA 139 142 139 137  K 3.1* 4.1 4.1 3.9  CL 100 106 108 108  CO2 29 25 21* 24  GLUCOSE 103* 98 106* 97  BUN 14 8 11 15   CREATININE 0.85 0.77 0.78 0.79  CALCIUM 8.5* 8.6* 8.6* 8.4*  MG  --  1.8  --   --    CBC: Recent Labs  Lab 04/28/23 0000 04/28/23 0800 04/29/23 0929 04/30/23 0458  WBC 7.1 8.1 10.3 6.6  NEUTROABS 4.2 5.5  --   --   HGB 11.1* 12.4 11.0* 10.9*  HCT 34.1* 39.4 34.7* 34.4*  MCV 87.7 91.0 88.1 89.8  PLT 190 221 231 201    Time spent in discharge (includes decision making & examination of pt): 35 minutes  05/01/2023, 12:39 PM   Lonia Blood, MD [facility] Office  [phone]

## 2023-05-01 NOTE — Progress Notes (Signed)
Occupational Therapy Treatment Patient Details Name: Brooke Owens MRN: [id number] DOB: [birth year] Today's Date: 05/01/2023   History of present illness 77 year old female who was brought from nursing facility 6/3 after she had a fall and started having left hip pain. She is now S/p L cannulated hip pinning 04/27/22. PMH of advanced dementia, hypothyroidism, hyperlipidemia   OT comments  Pt continuing to progress in OT sessions, she has no c/o of pain despite ambulation and no c/o dizziness and light headedness. Pt ambulating with Min guard assist using RW, without external support balance is worse, she needs cues for RW management. She likely needs some form of external support at this time to maintain balance. OT will continue to progress patient as able and reinforce RW management. DC plans remain appropriate for SNF as patient needs 24/7 support.    Recommendations for follow up therapy are one component of a multi-disciplinary discharge planning process, led by the attending physician.  Recommendations may be updated based on patient status, additional functional criteria and insurance authorization.    Assistance Recommended at Discharge Frequent or constant Supervision/Assistance  Patient can return home with the following  A little help with walking and/or transfers;A little help with bathing/dressing/bathroom;Direct supervision/assist for medications management;Direct supervision/assist for financial management;Assistance with cooking/housework   Equipment Recommendations  Other (comment) (defer to next level of care)    Recommendations for Other Services      Precautions / Restrictions Precautions Precautions: Fall Precaution Comments: watch BP Restrictions Weight Bearing Restrictions: No LLE Weight Bearing: Weight bearing as tolerated       Mobility Bed Mobility Overal bed mobility: Needs Assistance Bed Mobility: Supine to Sit     Supine to sit: Supervision           Transfers Overall transfer level: Needs assistance Equipment used: Rolling walker (2 wheels) Transfers: Sit to/from Stand Sit to Stand: Min assist           General transfer comment: Min A for STS no AD, pt given RW after standing for more stable ambulation     Balance Overall balance assessment: Needs assistance Sitting-balance support: Feet supported Sitting balance-Leahy Scale: Fair     Standing balance support: Bilateral upper extremity supported, During functional activity Standing balance-Leahy Scale: Poor                             ADL either performed or assessed with clinical judgement   ADL Overall ADL's : Needs assistance/impaired     Grooming: Standing;Min guard;Wash/dry hands                               Functional mobility during ADLs: Min guard;Rolling walker (2 wheels) General ADL Comments: Pt ambulated 259ft with RW min guard assist, continues to need RW management techniques. Attempted room level ambulation no AD but pt needing min A HHA and exhibited a much smaller gait stride    Extremity/Trunk Assessment              Vision       Perception     Praxis      Cognition Arousal/Alertness: Awake/alert Behavior During Therapy: WFL for tasks assessed/performed Overall Cognitive Status: History of cognitive impairments - at baseline  General Comments: Pt follows simple step commands inconsistently and with increased time/cueing.        Exercises      Shoulder Instructions       General Comments VSS on RA    Pertinent Vitals/ Pain       Pain Assessment Pain Assessment: No/denies pain  Home Living                                          Prior Functioning/Environment              Frequency  Min 2X/week        Progress Toward Goals  OT Goals(current goals can now be found in the care plan section)  Progress towards OT goals:  Progressing toward goals  Acute Rehab OT Goals Patient Stated Goal: to get better OT Goal Formulation: With patient/family Time For Goal Achievement: 05/13/23  Plan Discharge plan remains appropriate;Frequency remains appropriate    Co-evaluation                 AM-PAC OT "6 Clicks" Daily Activity     Outcome Measure   Help from another person eating meals?: A Little Help from another person taking care of personal grooming?: A Little Help from another person toileting, which includes using toliet, bedpan, or urinal?: A Little Help from another person bathing (including washing, rinsing, drying)?: A Little Help from another person to put on and taking off regular upper body clothing?: A Little Help from another person to put on and taking off regular lower body clothing?: A Little 6 Click Score: 18    End of Session Equipment Utilized During Treatment: Gait belt;Rolling walker (2 wheels)  OT Visit Diagnosis: Unsteadiness on feet (R26.81);Other abnormalities of gait and mobility (R26.89);Muscle weakness (generalized) (M62.81)   Activity Tolerance Patient tolerated treatment well   Patient Left in chair;with call bell/phone within reach;with chair alarm set   Nurse Communication Mobility status        Time: 7846-9629 OT Time Calculation (min): 26 min  Charges: OT General Charges $OT Visit: 1 Visit OT Treatments $Therapeutic Activity: 23-37 mins  05/01/2023  AB, OTR/L  Acute Rehabilitation Services  Office: [phone]   Tristan Schroeder 05/01/2023, 11:29 AM

## 2023-05-01 NOTE — TOC Transition Note (Signed)
Transition of Care [facility]) - CM/SW Discharge Note   Patient Details  Name: Brooke Owens MRN: [id number] Date of Birth: 11-24-1946  [facility]) CM/SW Contact:  Lorri Frederick, LCSW Phone Number: 05/01/2023, 12:27 PM   Clinical Narrative:   Pt discharging to [facility].  RN call report to [phone].    Final next level of care: Skilled Nursing Facility Barriers to Discharge: Barriers Resolved   Patient Goals and CMS Choice CMS Medicare.gov Compare Post Acute Care list provided to:: Patient Represenative (must comment) (daughter [name]) Choice offered to / list presented to : Adult Children  Discharge Placement                Patient chooses bed at: [facility] Re[facility] Patient to be transferred to facility by: ptar Name of family member notified: daughter [name] Patient and family notified of of transfer: 05/01/23  Discharge Plan and Services Additional resources added to the After Visit Summary for   In-house Referral: Clinical Social Work   Post Acute Care Choice: Skilled Nursing Facility                               Social Determinants of Health (SDOH) Interventions SDOH Screenings   Depression (PHQ2-9): Low Risk  (04/21/2019)  Tobacco Use: Low Risk  (04/29/2023)     Readmission Risk Interventions     No data to display

## 2023-05-01 NOTE — TOC Progression Note (Addendum)
Transition of Care [facility]) - Progression Note    Patient Details  Name: Sharnelle Cappelli MRN: [id number] Date of Birth: 09-Mar-1946  [facility]) CM/SW Contact  Lorri Frederick, LCSW Phone Number: 05/01/2023, 8:52 AM  Clinical Narrative:   [facility]  LM with daughter [name].  Medicare payer--inpatient order 04/28/23.   PASSR received: [phone] A  1000: [name] does want to accept offer at [facility].  CSW waiting on confirmation from [facility] that they can accept pt today--they are confirming pt has 3 night stay.    Expected Discharge Plan: Skilled Nursing Facility Barriers to Discharge: Continued Medical Work up, SNF Pending bed offer  Expected Discharge Plan and Services In-house Referral: Clinical Social Work   Post Acute Care Choice: Skilled Nursing Facility Living arrangements for the past 2 months: Assisted Living Facility [facility] memory care)                                       Social Determinants of Health (SDOH) Interventions SDOH Screenings   Depression (PHQ2-9): Low Risk  (04/21/2019)  Tobacco Use: Low Risk  (04/29/2023)    Readmission Risk Interventions     No data to display

## 2023-05-01 NOTE — Progress Notes (Signed)
Patient ID: Brooke Owens, female   DOB: 11/03/1946, 77 y.o.   MRN: [id number]   LOS: 3 days   Subjective: Doing well   Objective: Vital signs in last 24 hours: Temp:  [98 F (36.7 C)-98.5 F (36.9 C)] 98.5 F (36.9 C) (06/07 0737) Pulse Rate:  [70-73] 70 (06/07 0737) Resp:  [16-19] 18 (06/07 0737) BP: (94-158)/(41-54) 94/50 (06/07 0737) SpO2:  [92 %-98 %] 98 % (06/07 0737) Last BM Date : 04/27/23   Laboratory  CBC Recent Labs    04/29/23 0929 04/30/23 0458  WBC 10.3 6.6  HGB 11.0* 10.9*  HCT 34.7* 34.4*  PLT 231 201   BMET Recent Labs    04/29/23 0929 04/30/23 0458  NA 139 137  K 4.1 3.9  CL 108 108  CO2 21* 24  GLUCOSE 106* 97  BUN 11 15  CREATININE 0.78 0.79  CALCIUM 8.6* 8.4*     Physical Exam General appearance: alert and no distress LLE: Incision C/D/I, no erythema. Minimal TTP. EHL 5/5, sensation intact. 2+ DP.   Assessment/Plan: S/p IMN left hip -- F/u with Dr. Carola Frost in 2 weeks. Ok for d/c to SNF rehab.    Freeman Caldron, PA-C Orthopedic Surgery [phone] 05/01/2023

## 2023-05-01 NOTE — Progress Notes (Signed)
Physical Therapy Treatment Patient Details Name: Brooke Owens MRN: [id number] DOB: October 28, 1946 Today's Date: 05/01/2023   History of Present Illness 77 year old female who was brought from nursing facility 6/3 after she had a fall and started having left hip pain. She is now S/p L cannulated hip pinning 04/27/22. PMH of advanced dementia, hypothyroidism, hyperlipidemia    PT Comments    Pt was seen for attempt to stand but was very hypotensive and instead did bed level exercises.  Pt has lethargic presentation and had not eaten her meal, nursing in to start feeding her.  Pt had BP readings of 104/44 (60) and 111/48 (67).  Heading to SNF today with final BP being acceptable to transport.  Pt is continuing next venue but if not dc'd will continue therapy as outlined on acute PT POC.   Recommendations for follow up therapy are one component of a multi-disciplinary discharge planning process, led by the attending physician.  Recommendations may be updated based on patient status, additional functional criteria and insurance authorization.  Follow Up Recommendations  Can patient physically be transported by private vehicle: No    Assistance Recommended at Discharge Frequent or constant Supervision/Assistance  Patient can return home with the following A lot of help with walking and/or transfers;A little help with bathing/dressing/bathroom;Assistance with cooking/housework;Assist for transportation;Help with stairs or ramp for entrance   Equipment Recommendations  None recommended by PT    Recommendations for Other Services       Precautions / Restrictions Precautions Precautions: Fall Precaution Comments: watch BP Restrictions LLE Weight Bearing: Weight bearing as tolerated     Mobility  Bed Mobility Overal bed mobility: Needs Assistance             General bed mobility comments: declined OOB due to BP    Transfers                   General transfer comment: deferred  d/t BP    Ambulation/Gait               General Gait Details: hold d/t BP   Stairs             Wheelchair Mobility    Modified Rankin (Stroke Patients Only)       Balance                                            Cognition Arousal/Alertness: Lethargic Behavior During Therapy: Flat affect Overall Cognitive Status: History of cognitive impairments - at baseline                                 General Comments: pt is a bit limited by lethargy to actively do exercises, requires repeated information and recues for movement        Exercises General Exercises - Lower Extremity Ankle Circles/Pumps: AROM, AAROM, 5 reps Quad Sets: 10 reps, AAROM Heel Slides: AAROM, 10 reps Hip ABduction/ADduction: AAROM, 10 reps    General Comments General comments (skin integrity, edema, etc.): Pt was hypotensive, nursing in to recheck with mult cuff sizes and locations      Pertinent Vitals/Pain Pain Assessment Pain Assessment: Faces Faces Pain Scale: Hurts little more Pain Location: L hip Pain Descriptors / Indicators: Guarding, Operative site guarding Pain Intervention(s): Limited activity within patient's tolerance, Monitored during  session, Premedicated before session, Repositioned    Home Living                          Prior Function            PT Goals (current goals can now be found in the care plan section) Acute Rehab PT Goals Patient Stated Goal: none stated Progress towards PT goals: Not progressing toward goals - comment    Frequency    Min 3X/week      PT Plan Current plan remains appropriate    Co-evaluation              AM-PAC PT "6 Clicks" Mobility   Outcome Measure  Help needed turning from your back to your side while in a flat bed without using bedrails?: A Little Help needed moving from lying on your back to sitting on the side of a flat bed without using bedrails?: A Little Help  needed moving to and from a bed to a chair (including a wheelchair)?: A Little Help needed standing up from a chair using your arms (e.g., wheelchair or bedside chair)?: A Little Help needed to walk in hospital room?: A Lot Help needed climbing 3-5 steps with a railing? : Total 6 Click Score: 15    End of Session   Activity Tolerance: Patient limited by fatigue;Treatment limited secondary to medical complications (Comment) Patient left: in bed;with call bell/phone within reach;with bed alarm set Nurse Communication: Other (comment) (discussed BP readings) PT Visit Diagnosis: Unsteadiness on feet (R26.81);Other abnormalities of gait and mobility (R26.89);Repeated falls (R29.6);Muscle weakness (generalized) (M62.81);Difficulty in walking, not elsewhere classified (R26.2)     Time: 0981-1914 PT Time Calculation (min) (ACUTE ONLY): 18 min  Charges:  $Therapeutic Exercise: 8-22 mins $Therapeutic Activity: 8-22 mins          Ivar Drape 05/01/2023, 4:21 PM  Samul Dada, PT PhD Acute Rehab Dept. Number: [facility] [id number] and [facility] [phone]

## 2023-05-01 NOTE — Care Management Important Message (Signed)
Important Message  Patient Details  Name: Malky Rudzinski MRN: [id number] Date of Birth: 04/24/1946   Medicare Important Message Given:  Yes     Sherilyn Banker 05/01/2023, 4:14 PM

## 2023-05-10 NOTE — Op Note (Signed)
OPERATIVE REPORT 04/28/2023  11:05 PM  PATIENT:  Brooke Owens  77 y.o. female  PRE-OPERATIVE DIAGNOSIS: IMPACTED LEFT FEMORAL NECK FRACTURE    POST-OPERATIVE DIAGNOSIS:  closed fractture femoral neck left  PROCEDURE: CANNULATED SCREW FIXATION OF LEFT FEMORAL NECK FRACTURE WITH 8.0 MM BIOMET CANNULATED SCREWS WITH STAR DRIVE HEADS  SURGEON:  Surgeon(s) and Role:    Myrene Galas, MD - Primary  PHYSICIAN ASSISTANT: None.  ANESTHESIA:   general  I/O:  No intake/output data recorded.  SPECIMEN:  No Specimen  TOURNIQUET: NONE  COMPLICATIONS: NONE  DICTATION: Note written in EPIC  DISPOSITION: TO PACU  CONDITION: STABLE  DELAY START OF DVT PROPHYLAXIS BECAUSE OF BLEEDING RISK: NO   BRIEF SUMMARY OF INDICATIONS FOR PROCEDURE:  Brooke Owens is a very pleasant 77 y.o. who sustained a ground level fall resulting in hip pain, inability to bear weight. Subsequent x-rays demonstrated a comminuted valgus impacted femoral neck fracture.  We did discuss the risks and benefits of surgical fixation including the possibility of avascular necrosis, nonunion, malunion, loss of fixation, need for conversion to total hip arthroplasty or other surgery, DVT, PE, heart attack, stroke, loss of motion, and multiple others including infection, and symptomatic hardware.  After full discussion of these risks and others, the patient wished to proceed.  DESCRIPTION OF PROCEDURE:  The patient was taken to the operating room where general anesthesia was induced.  Preoperative antibiotics consisting of Ancef were administered. The patient was very carefully positioned on the radiolucent table with a bump under the pelvis on the side of the fracture. C-arm was brought in to confirm appropriate images and reduction. Standard prep and drape were then performed using chlorhexidine scrub, followed by Betadine scrub and paint.  C-arm was brought in to confirm the appropriate  starting position for a 4 cm incision and checked on both AP and lateral planes. The incision was made.  Dissection carried carefully down to the tensor Which was split in line to expose the vastus lateralis.  The guide pin for these screws was then placed inferior and central and advanced along the calcar into the femoral head checking it on 2 views. Two additional guide pins were then placed superior to this, one superior anterior and the other superior posterior. Began with the inferior screw, drilling the lateral cortex, then advancing the threads across the fracture site and engaging it, checking under C-arm for compression.  I did use a washer, and additionally 2 screws with washers were placed proximally achieving excellent fixation with inverted triangle pattern across the femoral neck into the head.  There was outstanding bite in the femoral head as well.  Wound was irrigated thoroughly.  I did split and spread the tensor and was able to repair this back with a simple 0 Vicryl sutures.  The deep subcu was repaired with #1 Vicryl and then a 2-0 Vicryl and a 3-0 nylon horizontal mattress for the skin layer.  Sterile gently compressive dressing was applied.  The patient was awakened from anesthesia and transported to the PACU in stable condition.     PROGNOSIS:  Patient will be partial weightbearing on the left lower extremity with walker and will be allowed to proceed with discharge to facility as soon cleared by PT.  Anticipate being on DVT prophylaxis with Lovenox short term given dementia.      Brooke Owens. Brooke Owens, M.D.

## 2023-06-14 ENCOUNTER — Emergency Department (HOSPITAL_COMMUNITY)
Admission: EM | Admit: 2023-06-14 | Discharge: 2023-06-14 | Disposition: A | Discharge status: Other Acute Inpatient Hospital | Payer: Medicare Other | Attending: Emergency Medicine | Admitting: Emergency Medicine

## 2023-06-14 ENCOUNTER — Emergency Department (HOSPITAL_COMMUNITY): Payer: Medicare Other

## 2023-06-14 DIAGNOSIS — Y92129 Unspecified place in nursing home as the place of occurrence of the external cause: Secondary | ICD-10-CM | POA: Insufficient documentation

## 2023-06-14 DIAGNOSIS — W19XXXA Unspecified fall, initial encounter: Secondary | ICD-10-CM | POA: Diagnosis not present

## 2023-06-14 DIAGNOSIS — F0390 Unspecified dementia without behavioral disturbance: Secondary | ICD-10-CM | POA: Insufficient documentation

## 2023-06-14 DIAGNOSIS — S0990XA Unspecified injury of head, initial encounter: Secondary | ICD-10-CM | POA: Diagnosis present

## 2023-06-14 DIAGNOSIS — S0101XA Laceration without foreign body of scalp, initial encounter: Secondary | ICD-10-CM | POA: Diagnosis not present

## 2023-06-14 NOTE — ED Notes (Signed)
Pt back from CT

## 2023-06-14 NOTE — ED Notes (Signed)
Patient transported to CT 

## 2023-06-14 NOTE — ED Provider Notes (Signed)
[place] EMERGENCY DEPARTMENT AT [facility] Provider Note   CSN: [id number] Arrival date & time: 06/14/23  3086     History  Chief Complaint  Patient presents with   Fall    Unwitnessed fall from [facility], pt with  0.5 cm lac to back of head , pt tolerated cleaning, denies pain     Brooke Owens is a 77 y.o. female.  The history is provided by the nursing home. The history is limited by the condition of the patient (Dementia).  Fall  She was sent here from a skilled nursing facility where she had an unwitnessed fall and a scalp laceration.  She has severe dementia and is not able to give any relevant history.   Home Medications Prior to Admission medications   Medication Sig Start Date End Date Taking? Authorizing Provider  acetaminophen (TYLENOL) 325 MG tablet Take 1-2 tablets (325-650 mg total) by mouth every 6 (six) hours as needed for mild pain (pain score 1-3 or temp > 100.5). 05/01/23   Lonia Blood, MD  atorvastatin (LIPITOR) 20 MG tablet Take 1 tablet (20 mg total) by mouth every other day. Patient taking differently: Take 20 mg by mouth at bedtime. 07/14/19   Philip Aspen, Limmie Patricia, MD  enoxaparin (LOVENOX) 40 MG/0.4ML injection Inject 0.4 mLs (40 mg total) into the skin daily for 21 days. 05/02/23 05/23/23  Lonia Blood, MD  FLUoxetine (PROZAC) 20 MG capsule Take 20 mg by mouth daily.    [provider]  HYDROcodone-acetaminophen (NORCO/VICODIN) 5-325 MG tablet Take 1-2 tablets by mouth every 6 (six) hours as needed for severe pain. 05/01/23   Lonia Blood, MD  levothyroxine (SYNTHROID) 88 MCG tablet Take 88 mcg by mouth daily before breakfast.    [provider]  melatonin 3 MG TABS tablet Take 1 tablet (3 mg total) by mouth at bedtime as needed (insomnia). 05/01/23   Lonia Blood, MD      Allergies    Meperidine hcl    Review of Systems   Review of Systems  Unable to perform ROS: Dementia    Physical  Exam Updated Vital Signs BP 117/69   Pulse 64   Temp 98 F (36.7 C) (Oral)   Resp 19   SpO2 100%  Physical Exam Vitals and nursing note reviewed.   77 year old female, resting comfortably and in no acute distress. Vital signs are normal. Oxygen saturation is 100%, which is normal. Head is normocephalic.  Small laceration present on the left side of the occiput. PERRLA, EOMI. Neck is nontender. Back is nontender and there is no CVA tenderness. Lungs are clear without rales, wheezes, or rhonchi. Chest is nontender. Heart has regular rate and rhythm without murmur. Abdomen is soft, flat, nontender. Extremities have no cyanosis or edema. Skin is warm and dry without rash. Neurologic: Awake, oriented to person but not place or time.  Cranial nerves are grossly intact.  Moves all extremities equally but generalized increased muscle tone.  ED Results / Procedures / Treatments    Radiology No results found.  Procedures .Marland KitchenLaceration Repair  Date/Time: 06/14/2023 4:20 AM  Performed by: Dione Booze, MD Authorized by: Dione Booze, MD   Consent:    Consent obtained: Implied consent.   Consent given by: Implied consent. Universal protocol:    Relevant documents present and verified: yes     Test results available: yes     Imaging studies available: yes     Required  blood products, implants, devices, and special equipment available: yes     Site/side marked: yes     Immediately prior to procedure, a time out was called: yes     Patient identity confirmed:  Verbally with patient and arm band Anesthesia:    Anesthesia method:  None Laceration details:    Location:  Scalp   Scalp location:  Occipital   Length (cm):  1   Depth (mm):  3 Pre-procedure details:    Preparation:  Patient was prepped and draped in usual sterile fashion and imaging obtained to evaluate for foreign bodies Exploration:    Limited defect created (wound extended): no     Hemostasis achieved with:  Direct  pressure   Imaging obtained: x-ray     Imaging outcome: foreign body not noted     Wound exploration: entire depth of wound visualized     Wound extent: no foreign body and no underlying fracture     Contaminated: no   Treatment:    Area cleansed with:  Saline   Amount of cleaning:  Standard   Debridement:  None   Undermining:  None Skin repair:    Repair method:  Staples   Number of staples:  2 Approximation:    Approximation:  Close Repair type:    Repair type:  Simple Post-procedure details:    Dressing:  Open (no dressing)   Procedure completion:  Tolerated well, no immediate complications     Medications Ordered in ED Medications - No data to display  ED Course/ Medical Decision Making/ A&P                             Medical Decision Making Amount and/or Complexity of Data Reviewed Radiology: ordered.   Unwitnessed fall with small scalp laceration.  I have ordered CT of head and cervical spine.  I have reviewed her past records, and on 04/27/2023 she was admitted for a fall with femur fracture, several other ED visits for falls.  CT scans show no acute injury.  I have independently viewed the images, and agree with the radiologist's interpretation.  I have closed the laceration with staples and I am discharging her back to her skilled nursing facility with instructions to have staples removed in 7 days.  Final Clinical Impression(s) / ED Diagnoses Final diagnoses:  Fall at nursing home, initial encounter  Occipital scalp laceration, initial encounter    Rx / DC Orders ED Discharge Orders     None         Dione Booze, MD 06/14/23 0422

## 2023-09-07 IMAGING — DX DG HAND COMPLETE 3+V*L*
3 series · 3 of 3 positions shown · non-contrast
Comparison: None.

CLINICAL DATA: Fall, left hand injury

EXAM:
LEFT HAND - COMPLETE 3+ VIEW

[hand ap]
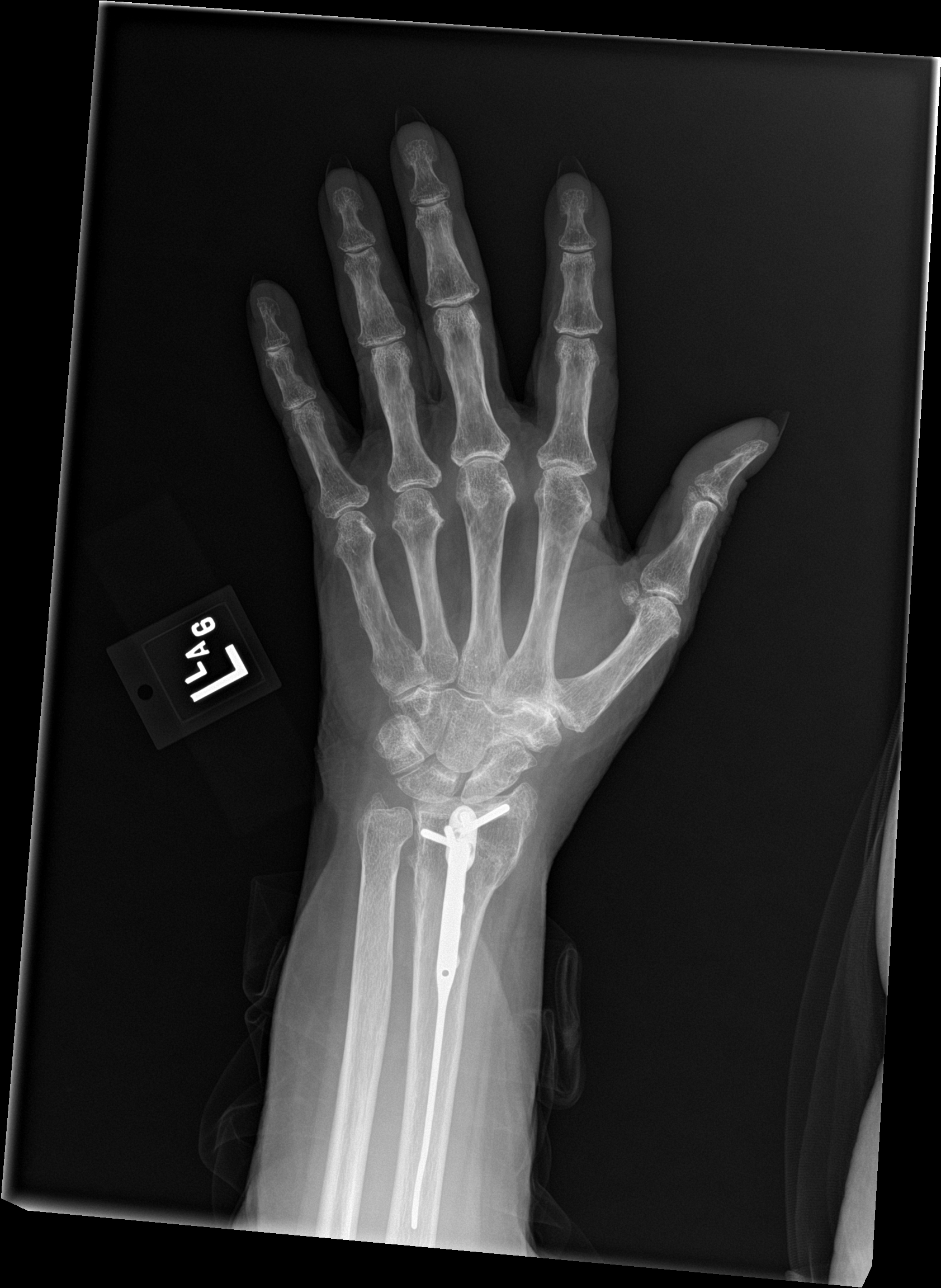

[hand obl]
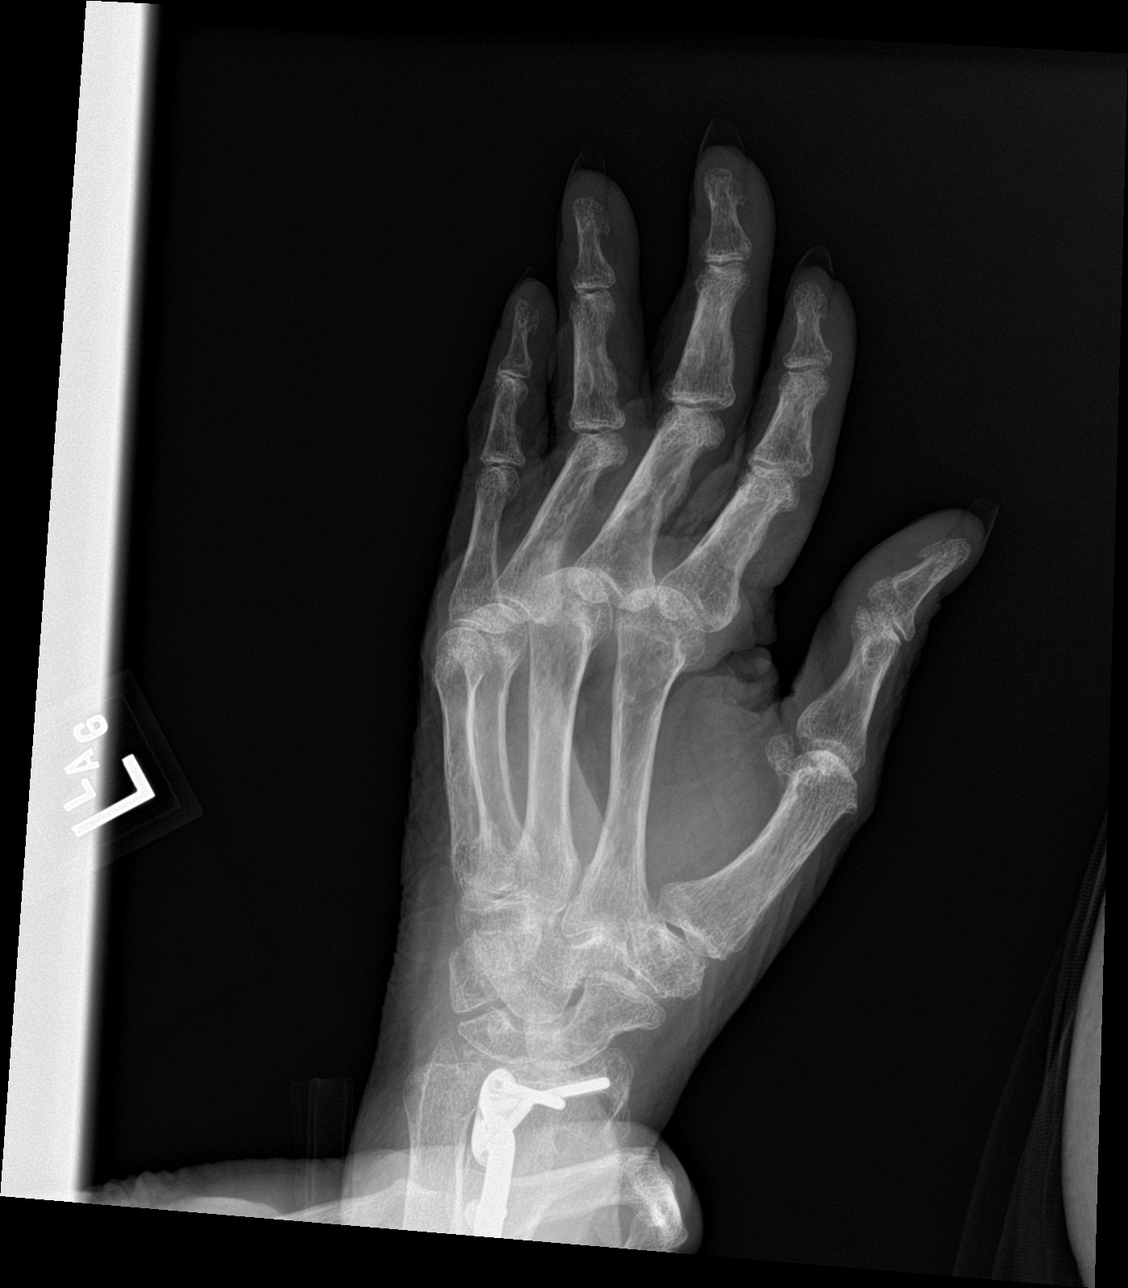

[hand lat]
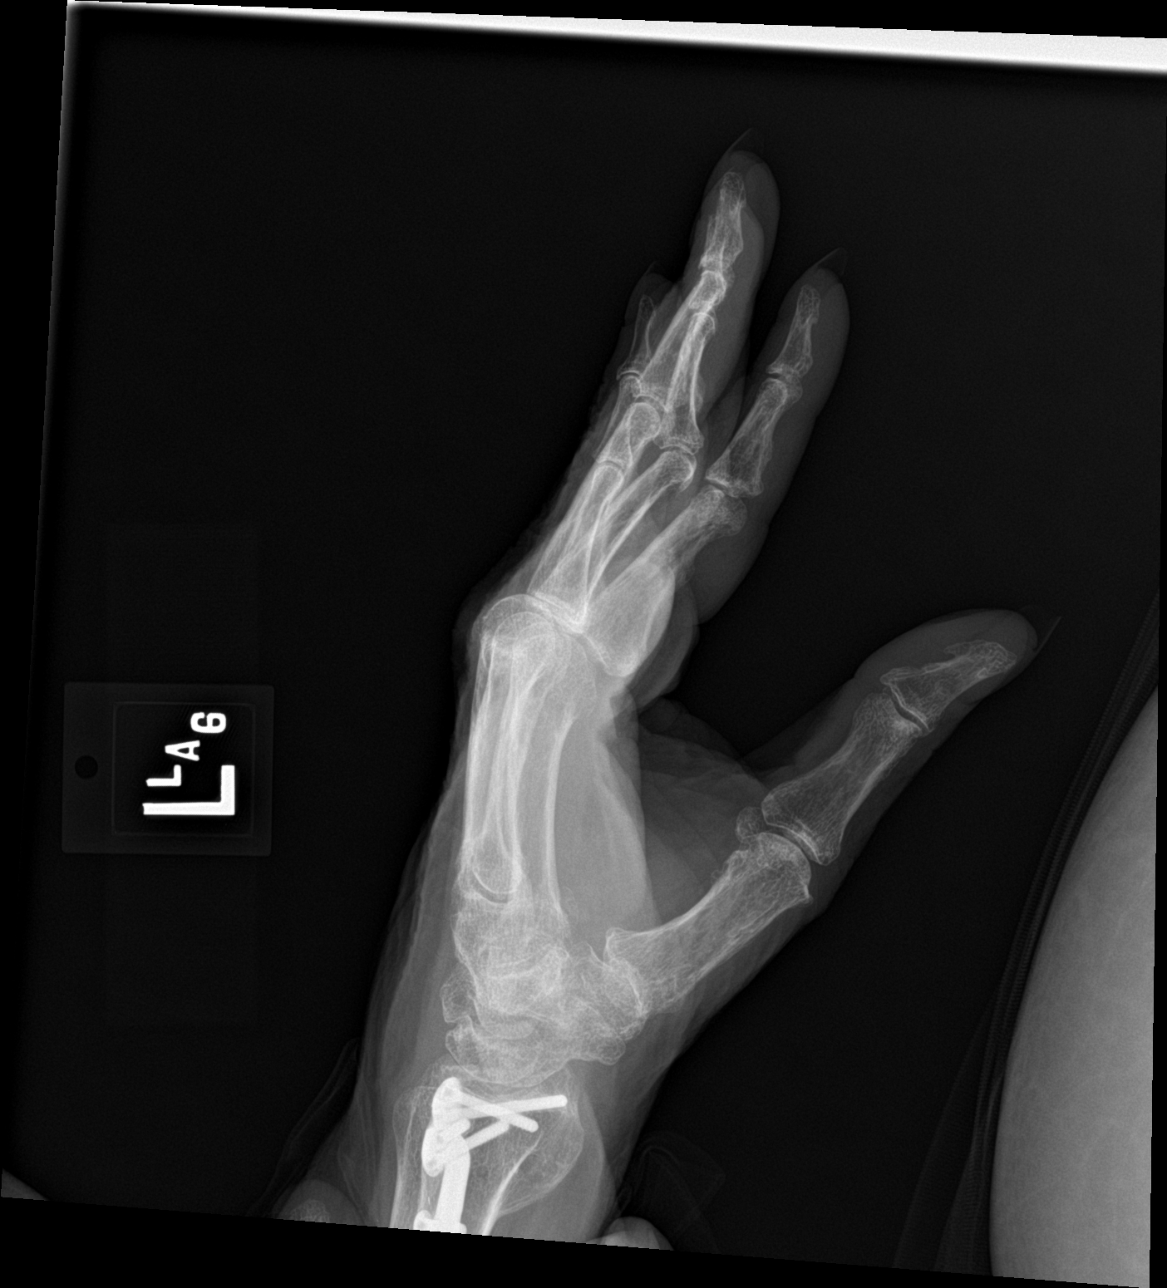

[3 of 3 positions shown; findings below may reference images not displayed]

FINDINGS: Distal radial ORIF has been performed normal alignment. No acute
fracture or dislocation. Mild degenerative changes noted at the
first metacarpophalangeal joint of the thumb and third PIP joint.
Remaining joint spaces are preserved. Soft tissues are unremarkable.
IMPRESSION: No acute abnormality.

## 2023-11-12 ENCOUNTER — Emergency Department (HOSPITAL_COMMUNITY)
Admission: EM | Admit: 2023-11-12 | Discharge: 2023-11-12 | Disposition: A | Discharge status: Home / Self Care | Payer: Medicare Other | Attending: Emergency Medicine | Admitting: Emergency Medicine

## 2023-11-12 ENCOUNTER — Emergency Department (HOSPITAL_COMMUNITY): Payer: Medicare Other

## 2023-11-12 ENCOUNTER — Other Ambulatory Visit: Payer: Self-pay

## 2023-11-12 ENCOUNTER — Encounter (HOSPITAL_COMMUNITY): Payer: Self-pay | Admitting: *Deleted

## 2023-11-12 DIAGNOSIS — Z7982 Long term (current) use of aspirin: Secondary | ICD-10-CM | POA: Insufficient documentation

## 2023-11-12 DIAGNOSIS — W19XXXA Unspecified fall, initial encounter: Secondary | ICD-10-CM

## 2023-11-12 DIAGNOSIS — W010XXA Fall on same level from slipping, tripping and stumbling without subsequent striking against object, initial encounter: Secondary | ICD-10-CM | POA: Diagnosis not present

## 2023-11-12 DIAGNOSIS — S8002XA Contusion of left knee, initial encounter: Secondary | ICD-10-CM | POA: Diagnosis not present

## 2023-11-12 DIAGNOSIS — S0181XA Laceration without foreign body of other part of head, initial encounter: Secondary | ICD-10-CM | POA: Insufficient documentation

## 2023-11-12 DIAGNOSIS — S0990XA Unspecified injury of head, initial encounter: Secondary | ICD-10-CM | POA: Diagnosis present

## 2023-11-12 MED ORDER — LIDOCAINE-EPINEPHRINE (PF) 2 %-1:200000 IJ SOLN
10.0000 mL | Freq: Once | INTRAMUSCULAR | Status: AC
  Administered 2023-11-12: 10 mL via INTRADERMAL
  Filled 2023-11-12: qty 20

## 2023-11-12 NOTE — ED Triage Notes (Signed)
BIB [facility] from [facility] s/p fall, hit head, ~2.6cm vertical laceration to L forehead, takes ASA, no other blood thinners, no LOC, "tumbled out of w/c, VSS, BS 141, baseline mentation, h/o dementia, alert, NAD, calm, interactive, follows commands, confused. EMS mentions fell onto L hip, although R hip is hard, questionable shortening and/or rotation.

## 2023-11-12 NOTE — ED Notes (Signed)
Daughter at [facility]. Daughter and pt updated. Pending CT. Laceration/ head cleaned with saline. Dressing applied.

## 2023-11-12 NOTE — ED Notes (Signed)
Xray at [facility] 

## 2023-11-12 NOTE — Discharge Instructions (Signed)
Your CT scans looked good.  There is no obvious broken bone in your pelvis or in your left knee.  Please follow-up with your family doctor in the office. Return for redness drainage or if you get a fever.  The sutures that were used are dissolvable that should dissolve between day 3 and day 5.  If they are still there after day 7 then you can gently plucked them out with tweezers.  The area can get wet but not fully immersed underwater.  No scrubbing.  If you really want to clean it you can apply a half-and-half hydrogen peroxide solution with water on a Q-tip.  You can apply an ointment a couple times a day this could be as simple as Vaseline but could also be an antibiotic ointment if you wish.  Once it is healed please try to avoid prolonged sun exposure use sunscreen.  Gells that have silicone antigens have been shown to reduce scarring in some research.

## 2023-11-12 NOTE — ED Notes (Signed)
Patient transported to CT 

## 2023-11-12 NOTE — ED Provider Notes (Signed)
[facility] AT [facility] Provider Note   CSN: [id number] Arrival date & time: 11/12/23  1644     History  Chief Complaint  Patient presents with   Fall   Laceration    Brooke Owens is a 77 y.o. female.  77 yo F with a chief complaints of fall.  She tells me that she slipped.  She is unable to provide much further history.  She think she has pain in her hips as well as her neck.  She denies pain in the back chest or abdomen.  She tells me she walks sometimes.   Fall  Laceration      Home Medications Prior to Admission medications   Medication Sig Start Date End Date Taking? Authorizing Provider  acetaminophen (TYLENOL) 650 MG CR tablet Take 650 mg by mouth every 6 (six) hours as needed for pain or fever (headache).   Yes [provider]  aspirin EC 325 MG tablet Take 325 mg by mouth daily.   Yes [provider]  atorvastatin (LIPITOR) 20 MG tablet Take 1 tablet (20 mg total) by mouth every other day. 07/14/19  Yes Philip Aspen, Limmie Patricia, MD  FLUoxetine (PROZAC) 20 MG capsule Take 20 mg by mouth daily.   Yes [provider]  hydrochlorothiazide (HYDRODIURIL) 25 MG tablet Take 25 mg by mouth daily.   Yes [provider]  levothyroxine (SYNTHROID) 88 MCG tablet Take 88 mcg by mouth daily before breakfast.   Yes [provider]  LORazepam (ATIVAN) 0.5 MG tablet Take 0.5 mg by mouth every 6 (six) hours as needed for anxiety.   Yes [provider]  Wound Cleansers (SIMPLY SALINE WOUND WASH SPRAY EX) Apply 1 application  topically See admin instructions. Apply to treat L calf. Cleanse with NS, apply Xeroform and wrap with Kerlix and Coban toes to knee twice a week.   Yes [provider]      Allergies    Demerol [meperidine hcl]    Review of Systems   Review of Systems  Physical Exam Updated Vital Signs BP (!) 106/52   Pulse 65   Temp 98.5 F (36.9 C) (Oral)   Resp 13   Wt  79.8 kg   SpO2 96%   BMI 30.21 kg/m  Physical Exam Vitals and nursing note reviewed.  Constitutional:      General: She is not in acute distress.    Appearance: She is well-developed. She is not diaphoretic.  HENT:     Head: Normocephalic.     Comments: Stellate laceration to the left frontal region Eyes:     Pupils: Pupils are equal, round, and reactive to light.  Cardiovascular:     Rate and Rhythm: Normal rate and regular rhythm.     Heart sounds: No murmur heard.    No friction rub. No gallop.  Pulmonary:     Effort: Pulmonary effort is normal.     Breath sounds: No wheezing or rales.  Abdominal:     General: There is no distension.     Palpations: Abdomen is soft.     Tenderness: There is no abdominal tenderness.  Musculoskeletal:        General: Tenderness present.     Cervical back: Normal range of motion and neck supple.     Comments: Some bruising to the left knee.  No obvious pain with internal/external rotation of either lower extremity.  No midline spinal tenderness step-offs or deformities.  Palpated from  head to toe without any obvious other noted areas of bony tenderness.  Skin:    General: Skin is warm and dry.  Neurological:     Mental Status: She is alert and oriented to person, place, and time.  Psychiatric:        Behavior: Behavior normal.     ED Results / Procedures / Treatments   Labs (all labs ordered are listed, but only abnormal results are displayed) Labs Reviewed - No data to display  EKG None  Radiology DG Pelvis Portable Result Date: 11/12/2023 CLINICAL DATA:  Hip pain EXAM: PORTABLE PELVIS 1-2 VIEWS COMPARISON:  Pelvis x-ray 04/27/2023 FINDINGS: There are 3 new left sided nails fixating left femoral neck fracture. Alignment is anatomic. No evidence for hardware loosening. No new fractures are seen. No dislocation. The bones are osteopenic. IMPRESSION: New left-sided nails fixating left femoral neck fracture. Alignment is anatomic.  Electronically Signed   By: Darliss Cheney M.D.   On: 11/12/2023 21:06   DG Knee Left Port Result Date: 11/12/2023 CLINICAL DATA:  Recent fall with left knee pain, initial encounter EXAM: PORTABLE LEFT KNEE - 3 VIEW COMPARISON:  None Available. FINDINGS: Degenerative changes of the knee joint are seen. The patella is somewhat laterally displaced. This may simply represent subluxation although dislocation could not be totally excluded. No joint effusion is seen. No other focal abnormality is noted. IMPRESSION: Degenerative changes with findings suspicious for lateral subluxation/dislocation of the patella. No acute fracture is noted. Electronically Signed   By: Alcide Clever M.D.   On: 11/12/2023 21:00   CT Head Wo Contrast Result Date: 11/12/2023 CLINICAL DATA:  Head and neck trauma EXAM: CT HEAD WITHOUT CONTRAST CT CERVICAL SPINE WITHOUT CONTRAST TECHNIQUE: Multidetector CT imaging of the head and cervical spine was performed following the standard protocol without intravenous contrast. Multiplanar CT image reconstructions of the cervical spine were also generated. RADIATION DOSE REDUCTION: This exam was performed according to the departmental dose-optimization program which includes automated exposure control, adjustment of the mA and/or kV according to patient size and/or use of iterative reconstruction technique. COMPARISON:  06/14/2023 FINDINGS: CT HEAD FINDINGS Brain: No evidence of acute infarct, hemorrhage, mass, mass effect, or midline shift. No hydrocephalus or extra-axial fluid collection. Advanced cerebral volume loss for age. Vascular: No hyperdense vessel. Atherosclerotic calcifications in the intracranial carotid and vertebral arteries. Skull: Negative for fracture or focal lesion. Left frontal and periorbital scalp hematoma and laceration. Sinuses/Orbits: No acute finding. Other: The mastoid air cells are well aerated. CT CERVICAL SPINE FINDINGS Alignment: No traumatic listhesis. Skull base and  vertebrae: No acute fracture or suspicious osseous lesion. Soft tissues and spinal canal: No prevertebral fluid or swelling. No visible canal hematoma. Disc levels: Degenerative changes in the cervical spine.No high-grade spinal canal stenosis. Upper chest: No focal pulmonary opacity or pleural effusion. IMPRESSION: 1. No acute intracranial process. 2. Left frontal and periorbital scalp hematoma and laceration. 3. No acute fracture or traumatic listhesis in the cervical spine. Electronically Signed   By: Wiliam Ke M.D.   On: 11/12/2023 20:53   CT Cervical Spine Wo Contrast Result Date: 11/12/2023 CLINICAL DATA:  Head and neck trauma EXAM: CT HEAD WITHOUT CONTRAST CT CERVICAL SPINE WITHOUT CONTRAST TECHNIQUE: Multidetector CT imaging of the head and cervical spine was performed following the standard protocol without intravenous contrast. Multiplanar CT image reconstructions of the cervical spine were also generated. RADIATION DOSE REDUCTION: This exam was performed according to the departmental dose-optimization program which includes automated exposure  control, adjustment of the mA and/or kV according to patient size and/or use of iterative reconstruction technique. COMPARISON:  06/14/2023 FINDINGS: CT HEAD FINDINGS Brain: No evidence of acute infarct, hemorrhage, mass, mass effect, or midline shift. No hydrocephalus or extra-axial fluid collection. Advanced cerebral volume loss for age. Vascular: No hyperdense vessel. Atherosclerotic calcifications in the intracranial carotid and vertebral arteries. Skull: Negative for fracture or focal lesion. Left frontal and periorbital scalp hematoma and laceration. Sinuses/Orbits: No acute finding. Other: The mastoid air cells are well aerated. CT CERVICAL SPINE FINDINGS Alignment: No traumatic listhesis. Skull base and vertebrae: No acute fracture or suspicious osseous lesion. Soft tissues and spinal canal: No prevertebral fluid or swelling. No visible canal  hematoma. Disc levels: Degenerative changes in the cervical spine.No high-grade spinal canal stenosis. Upper chest: No focal pulmonary opacity or pleural effusion. IMPRESSION: 1. No acute intracranial process. 2. Left frontal and periorbital scalp hematoma and laceration. 3. No acute fracture or traumatic listhesis in the cervical spine. Electronically Signed   By: Wiliam Ke M.D.   On: 11/12/2023 20:53    Procedures .Laceration Repair  Date/Time: 11/12/2023 8:26 PM  Performed by: Melene Plan, DO Authorized by: Melene Plan, DO   Consent:    Consent obtained:  Verbal   Consent given by:  Patient   Risks, benefits, and alternatives were discussed: yes     Risks discussed:  Infection, pain, poor cosmetic result and poor wound healing   Alternatives discussed:  No treatment Universal protocol:    Procedure explained and questions answered to patient or proxy's satisfaction: yes     Immediately prior to procedure, a time out was called: yes     Patient identity confirmed:  Verbally with patient Anesthesia:    Anesthesia method:  Local infiltration   Local anesthetic:  Lidocaine 2% WITH epi Laceration details:    Location:  Face   Face location:  Forehead   Length (cm):  8.5 Pre-procedure details:    Preparation:  Patient was prepped and draped in usual sterile fashion Exploration:    Hemostasis achieved with:  Epinephrine and direct pressure   Imaging obtained comment:  CT   Imaging outcome: foreign body not noted     Wound exploration: entire depth of wound visualized     Wound extent: no underlying fracture   Treatment:    Area cleansed with:  Chlorhexidine   Amount of cleaning:  Standard   Irrigation solution:  Sterile saline   Irrigation volume:  50   Irrigation method:  Pressure wash   Debridement:  None   Undermining:  None   Scar revision: no   Skin repair:    Repair method:  Sutures   Suture size:  5-0   Suture material:  Fast-absorbing gut   Suture technique:   Simple interrupted   Number of sutures:  7 Approximation:    Approximation:  Close Repair type:    Repair type:  Simple Post-procedure details:    Dressing:  Antibiotic ointment and adhesive bandage   Procedure completion:  Tolerated well, no immediate complications     Medications Ordered in ED Medications  lidocaine-EPINEPHrine (XYLOCAINE W/EPI) 2 %-1:200000 (PF) injection 10 mL (10 mLs Intradermal Given by Other 11/12/23 1805)    ED Course/ Medical Decision Making/ A&P                                 Medical Decision Making Amount and/or  Complexity of Data Reviewed Radiology: ordered.  Risk Prescription drug management.   77 yo F with a chief complaints of a fall.  Nonsyncopal by history.  Patient is a poor historian and does not really know what is going on.  She has a left frontal laceration which I will suture at bedside.  CT of the head and C-spine.  Reportedly had pain in her hips will obtain a plain film of the pelvis.  Plain film of the left knee.  Plain film of the pelvis independently interpreted by me without fracture.  Plain film of the left knee independently interpreted by me without fracture.  CT of the head and C-spine without obvious acute intercranial intraspinal injury.  Will discharge home.  9:24 PM:  I have discussed the diagnosis/risks/treatment options with the patient.  Evaluation and diagnostic testing in the emergency department does not suggest an emergent condition requiring admission or immediate intervention beyond what has been performed at this time.  They will follow up with PCP. We also discussed returning to the ED immediately if new or worsening sx occur. We discussed the sx which are most concerning (e.g., sudden worsening pain, fever, inability to tolerate by mouth) that necessitate immediate return. Medications administered to the patient during their visit and any new prescriptions provided to the patient are listed below.  Medications  given during this visit Medications  lidocaine-EPINEPHrine (XYLOCAINE W/EPI) 2 %-1:200000 (PF) injection 10 mL (10 mLs Intradermal Given by Other 11/12/23 1805)     The patient appears reasonably screen and/or stabilized for discharge and I doubt any other medical condition or other [facility] requiring further screening, evaluation, or treatment in the ED at this time prior to discharge.          Final Clinical Impression(s) / ED Diagnoses Final diagnoses:  Fall, initial encounter    Rx / DC Orders ED Discharge Orders     None         Melene Plan, DO 11/12/23 2124

## 2023-11-22 ENCOUNTER — Emergency Department (HOSPITAL_COMMUNITY): Payer: Medicare Other

## 2023-11-22 ENCOUNTER — Emergency Department (HOSPITAL_COMMUNITY)
Admission: EM | Admit: 2023-11-22 | Discharge: 2023-11-23 | Disposition: A | Discharge status: Home / Self Care | Payer: Medicare Other | Attending: Emergency Medicine | Admitting: Emergency Medicine

## 2023-11-22 ENCOUNTER — Encounter (HOSPITAL_COMMUNITY): Payer: Self-pay

## 2023-11-22 DIAGNOSIS — E876 Hypokalemia: Secondary | ICD-10-CM | POA: Insufficient documentation

## 2023-11-22 DIAGNOSIS — W19XXXA Unspecified fall, initial encounter: Secondary | ICD-10-CM

## 2023-11-22 DIAGNOSIS — N3000 Acute cystitis without hematuria: Secondary | ICD-10-CM | POA: Diagnosis present

## 2023-11-22 DIAGNOSIS — D72829 Elevated white blood cell count, unspecified: Secondary | ICD-10-CM | POA: Diagnosis not present

## 2023-11-22 DIAGNOSIS — F0390 Unspecified dementia without behavioral disturbance: Secondary | ICD-10-CM | POA: Diagnosis not present

## 2023-11-22 DIAGNOSIS — Z7982 Long term (current) use of aspirin: Secondary | ICD-10-CM | POA: Insufficient documentation

## 2023-11-22 DIAGNOSIS — Z79899 Other long term (current) drug therapy: Secondary | ICD-10-CM | POA: Insufficient documentation

## 2023-11-22 DIAGNOSIS — R9389 Abnormal findings on diagnostic imaging of other specified body structures: Secondary | ICD-10-CM | POA: Diagnosis not present

## 2023-11-22 LAB — URINALYSIS, ROUTINE W REFLEX MICROSCOPIC
Bilirubin Urine: NEGATIVE
Glucose, UA: NEGATIVE mg/dL
Ketones, ur: 20 mg/dL — AB
Nitrite: NEGATIVE
Protein, ur: >=300 mg/dL — AB
Specific Gravity, Urine: 1.038 — ABNORMAL HIGH (ref 1.005–1.030)
WBC, UA: >50 WBC/hpf (ref 0–5)
pH: 8 (ref 5.0–8.0)

## 2023-11-22 LAB — BASIC METABOLIC PANEL
Anion gap: 10 (ref 5–15)
BUN: 26 mg/dL — ABNORMAL HIGH (ref 8–23)
CO2: 27 mmol/L (ref 22–32)
Calcium: 9.2 mg/dL (ref 8.9–10.3)
Chloride: 102 mmol/L (ref 98–111)
Creatinine, Ser: 0.8 mg/dL (ref 0.44–1.00)
GFR, Estimated: >60 mL/min (ref >60)
Glucose, Bld: 107 mg/dL — ABNORMAL HIGH (ref 70–99)
Potassium: 3.3 mmol/L — ABNORMAL LOW (ref 3.5–5.1)
Sodium: 139 mmol/L (ref 135–145)

## 2023-11-22 LAB — CBC
HCT: 37.7 % (ref 36.0–46.0)
Hemoglobin: 12.1 g/dL (ref 12.0–15.0)
MCH: 29.2 pg (ref 26.0–34.0)
MCHC: 32.1 g/dL (ref 30.0–36.0)
MCV: 91.1 fL (ref 80.0–100.0)
Platelets: 253 10*3/uL (ref 150–400)
RBC: 4.14 MIL/uL (ref 3.87–5.11)
RDW: 13.2 % (ref 11.5–15.5)
WBC: 14.8 10*3/uL — ABNORMAL HIGH (ref 4.0–10.5)
nRBC: 0 % (ref 0.0–0.2)

## 2023-11-22 LAB — CK: Total CK: 166 U/L (ref 38–234)

## 2023-11-22 LAB — CBG MONITORING, ED: Glucose-Capillary: 98 mg/dL (ref 70–99)

## 2023-11-22 MED ORDER — POTASSIUM CHLORIDE 20 MEQ PO PACK
40.0000 meq | PACK | Freq: Two times a day (BID) | ORAL | Status: DC
Start: 2023-11-22 — End: 2023-11-22

## 2023-11-22 MED ORDER — CEFPODOXIME PROXETIL 200 MG PO TABS: 200.0000 mg | ORAL_TABLET | Freq: Two times a day (BID) | ORAL | 0 refills | Status: AC

## 2023-11-22 MED ORDER — IOHEXOL 350 MG/ML SOLN
75.0000 mL | Freq: Once | INTRAVENOUS | Status: AC | PRN
  Administered 2023-11-22: 75 mL via INTRAVENOUS

## 2023-11-22 MED ORDER — LORAZEPAM 2 MG/ML IJ SOLN
0.5000 mg | Freq: Once | INTRAMUSCULAR | Status: AC
  Administered 2023-11-22: 0.5 mg via INTRAVENOUS
  Filled 2023-11-22: qty 1

## 2023-11-22 MED ORDER — LORAZEPAM 2 MG/ML IJ SOLN: 0.5000 mg | Freq: Once | INTRAMUSCULAR | Status: DC

## 2023-11-22 MED ORDER — POTASSIUM CHLORIDE 20 MEQ PO PACK: 40.0000 meq | PACK | ORAL | Status: DC

## 2023-11-22 NOTE — ED Notes (Signed)
Pt daughter requesting update on POC- EDP made aware

## 2023-11-22 NOTE — ED Triage Notes (Signed)
Pt to ED via [facility] c/o unwitnessed fall , unknown when it occurred. Pt fell in the common area, on ground on EMS arrival.  No obvious deformity per EMS. Orientation at baseline , hx dementia. . Takes ASA, No other blood thinners.   Last VS: 158/70, HR82,

## 2023-11-22 NOTE — ED Provider Notes (Cosign Needed)
[place] [facility] AT [facility] Provider Note   CSN: [id number] Arrival date & time: 11/22/23  1558     History Chief Complaint  Patient presents with   Marletta Lor    Brooke Owens is a 77 y.o. female with h/o dementia presents to the ER for evaluation of unwitnessed fall today.  History is limited secondary to dementia.  History obtained from EMS and facility.  Patient was found laying prone in the activity room/day room.  Patient is wheelchair-bound and does not ambulate per daughter/facility.  She currently is not complaining of any pain.  She is on baby aspirin but no anticoagulant use.  She denies any pain.  She is placed in a c-collar for precautions.  Level 5 caveat due to patient's dementia.   Fall       Home Medications Prior to Admission medications   Medication Sig Start Date End Date Taking? Authorizing Provider  acetaminophen (TYLENOL) 650 MG CR tablet Take 650 mg by mouth every 6 (six) hours as needed for pain or fever (headache).    [provider]  aspirin EC 325 MG tablet Take 325 mg by mouth daily.    [provider]  atorvastatin (LIPITOR) 20 MG tablet Take 1 tablet (20 mg total) by mouth every other day. 07/14/19   Philip Aspen, Limmie Patricia, MD  FLUoxetine (PROZAC) 20 MG capsule Take 20 mg by mouth daily.    [provider]  hydrochlorothiazide (HYDRODIURIL) 25 MG tablet Take 25 mg by mouth daily.    [provider]  levothyroxine (SYNTHROID) 88 MCG tablet Take 88 mcg by mouth daily before breakfast.    [provider]  LORazepam (ATIVAN) 0.5 MG tablet Take 0.5 mg by mouth every 6 (six) hours as needed for anxiety.    [provider]  Wound Cleansers (SIMPLY SALINE WOUND WASH SPRAY EX) Apply 1 application  topically See admin instructions. Apply to treat L calf. Cleanse with NS, apply Xeroform and wrap with Kerlix and Coban toes to knee twice a week.    [provider]       Allergies    Demerol [meperidine hcl]    Review of Systems   Review of Systems  Unable to perform ROS: Dementia    Physical Exam Updated Vital Signs BP (!) 100/42   Pulse 70   Temp 99.6 F (37.6 C) (Rectal)   Resp (!) 21   Wt 77.1 kg   SpO2 97%   BMI 29.18 kg/m  Physical Exam Vitals and nursing note reviewed.  Constitutional:      Comments: Chronically ill-appearing  HENT:     Head: Normocephalic.     Comments: Old scab present to the frontal left forehead.  No battle signs or raccoon eyes. Eyes:     General: No scleral icterus. Cardiovascular:     Rate and Rhythm: Normal rate.  Pulmonary:     Effort: Pulmonary effort is normal. No respiratory distress.     Breath sounds: Normal breath sounds.     Comments: Breast implants present in bilateral breast tissue.  Appears to have some issues/constipation/adhesions with the left breast.  No palpable induration or fluctuance.  No overlying erythema or warmth.  No signs of trauma. Abdominal:     Palpations: Abdomen is soft.     Tenderness: There is no abdominal tenderness. There is no guarding or rebound.     Comments: No overlying skin changes noted.  No signs of trauma.  Soft.  Nontender.  Musculoskeletal:     Cervical back: Normal range of motion.     Comments: Chronic bilateral lower edema to lower extremities.  Patient does have some various old stage bruising noted to the left knee and some on the right however is nontender to palpation.  Compartments are soft.  She does not have any tenderness to the upper bilateral extremities.  Palpable radial pulses that are symmetric.  Compartments are soft as well here.  She does not endorse any tenderness to the midline or paraspinal back.  Skin:    General: Skin is warm and dry.  Neurological:     Mental Status: She is alert. Mental status is at baseline.     Comments: Oriented to person but not place or time.  Reportedly patient's baseline.  Moving all extremities.     ED  Results / Procedures / Treatments   Labs (all labs ordered are listed, but only abnormal results are displayed) Labs Reviewed  BASIC METABOLIC PANEL - Abnormal; Notable for the following components:      Result Value   Potassium 3.3 (*)    Glucose, Bld 107 (*)    BUN 26 (*)    All other components within normal limits  CBC - Abnormal; Notable for the following components:   WBC 14.8 (*)    All other components within normal limits  URINALYSIS, ROUTINE W REFLEX MICROSCOPIC - Abnormal; Notable for the following components:   Color, Urine AMBER (*)    APPearance TURBID (*)    Specific Gravity, Urine 1.038 (*)    Hgb urine dipstick SMALL (*)    Ketones, ur 20 (*)    Protein, ur >=300 (*)    Leukocytes,Ua MODERATE (*)    Bacteria, UA FEW (*)    All other components within normal limits  URINE CULTURE  CK  CBG MONITORING, ED    EKG EKG Interpretation Date/Time:  Sunday November 22 2023 16:19:22 EST Ventricular Rate:  84 PR Interval:    QRS Duration:  134 QT Interval:  307 QTC Calculation: 363 R Axis:   270  Text Interpretation: Interpretation limited secondary to artifact Confirmed by Vanetta Mulders [id number]) on 11/22/2023 5:29:30 PM  Radiology CT CHEST ABDOMEN PELVIS W CONTRAST Result Date: 11/22/2023 CLINICAL DATA:  Polytrauma, blunt fall EXAM: CT CHEST, ABDOMEN, AND PELVIS WITH CONTRAST TECHNIQUE: Multidetector CT imaging of the chest, abdomen and pelvis was performed following the standard protocol during bolus administration of intravenous contrast. RADIATION DOSE REDUCTION: This exam was performed according to the departmental dose-optimization program which includes automated exposure control, adjustment of the mA and/or kV according to patient size and/or use of iterative reconstruction technique. CONTRAST:  75mL OMNIPAQUE IOHEXOL 350 MG/ML SOLN COMPARISON:  None Available. FINDINGS: CHEST: Cardiovascular: No aortic injury. The thoracic aorta is normal in caliber. The  heart is normal in size. No significant pericardial effusion. Four-vessel coronary artery calcification. The main pulmonary artery is normal in caliber. No central or proximal segmental pulmonary embolus. Limited evaluation more distally due to timing contrast. Mediastinum/Nodes: No pneumomediastinum. No mediastinal hematoma. The esophagus is unremarkable. The thyroid is unremarkable. The central airways are patent. No mediastinal, hilar, or axillary lymphadenopathy. Chronic right hilar and mediastinal calcified lymph nodes consistent with sequelae of prior granulomatous disease. Lungs/Pleura: No focal consolidation. Chronic centrally calcified 1 cm x 0.6 cm right upper lobe pulmonary nodule consistent with granuloma-no further follow-up indicated. No pulmonary mass. No pulmonary contusion or laceration. No pneumatocele formation. No pleural effusion. No pneumothorax.  No hemothorax. Musculoskeletal/Chest wall: No chest wall mass. Bilateral breast implants with peripheral calcification on the left. No acute rib or sternal fracture. No spinal fracture. Old healed posterior left rib fractures. Bilateral shoulder moderate, right greater than left, degenerative changes of the shoulder. Partially visualized left wrist surgical hardware. ABDOMEN / PELVIS: Hepatobiliary: Not enlarged. No focal lesion. No laceration or subcapsular hematoma. The gallbladder is otherwise unremarkable with no radio-opaque gallstones. No biliary ductal dilatation. Pancreas: Normal pancreatic contour. No main pancreatic duct dilatation. Spleen: Not enlarged. No focal lesion. No laceration, subcapsular hematoma, or vascular injury. Adrenals/Urinary Tract: No nodularity bilaterally. Bilateral kidneys enhance symmetrically. Fluid density lesions of the kidneys likely represent simple renal cysts. No hydronephrosis. No contusion, laceration, or subcapsular hematoma. No injury to the vascular structures or collecting systems. No hydroureter. Air-fluid  level within the urinary bladder lumen. Other the urinary bladder is unremarkable. Stomach/Bowel: No small or large bowel wall thickening or dilatation. The appendix is not definitely identified with no inflammatory changes in the right lower quadrant to suggest acute appendicitis. . Vasculature/Lymphatics: Atherosclerotic plaque. No abdominal aorta or iliac aneurysm. No active contrast extravasation or pseudoaneurysm. No abdominal, pelvic, inguinal lymphadenopathy. Reproductive: Normal. Other: No simple free fluid ascites. No pneumoperitoneum. No hemoperitoneum. No mesenteric hematoma identified. No organized fluid collection. Musculoskeletal: No significant soft tissue hematoma. No acute pelvic fracture. No spinal fracture. Screw fixation of the left femoral neck. Ports and Devices: None. IMPRESSION: 1. No acute intrathoracic, intra-abdominal, intrapelvic traumatic injury. 2. No acute fracture or traumatic malalignment of the thoracic or lumbar spine. 3. Air-fluid level within the urinary bladder lumen-correlate with recent instrumentation versus infection. Electronically Signed   By: Tish Frederickson M.D.   On: 11/22/2023 19:46   CT Head Wo Contrast Result Date: 11/22/2023 CLINICAL DATA:  Neck trauma (Age >= 65y); Head trauma, minor (Age >= 65y). EXAM: CT HEAD WITHOUT CONTRAST CT CERVICAL SPINE WITHOUT CONTRAST TECHNIQUE: Multidetector CT imaging of the head and cervical spine was performed following the standard protocol without intravenous contrast. Multiplanar CT image reconstructions of the cervical spine were also generated. RADIATION DOSE REDUCTION: This exam was performed according to the departmental dose-optimization program which includes automated exposure control, adjustment of the mA and/or kV according to patient size and/or use of iterative reconstruction technique. COMPARISON:  CT head and cervical spine 11/12/2023. FINDINGS: CT HEAD FINDINGS Brain: No acute intracranial hemorrhage. Gray-white  differentiation is preserved. No hydrocephalus or extra-axial collection. No mass effect or midline shift. Vascular: No hyperdense vessel or unexpected calcification. Skull: No calvarial fracture or suspicious bone lesion. Skull base is unremarkable. Sinuses/Orbits: No acute finding. Other: Decreased size of the previously seen left frontal scalp hematoma. CT CERVICAL SPINE FINDINGS Alignment: Normal. Skull base and vertebrae: No acute fracture. Normal craniocervical junction. No suspicious bone lesions. Soft tissues and spinal canal: No prevertebral fluid or swelling. No visible canal hematoma. Disc levels: Unchanged mild cervical spondylosis without high-grade spinal canal stenosis. Upper chest: No acute findings. Other: None. IMPRESSION: 1. No acute intracranial abnormality. 2. No acute cervical spine fracture or traumatic listhesis. 3. Decreased size of the previously seen left frontal scalp hematoma. Electronically Signed   By: Orvan Falconer M.D.   On: 11/22/2023 19:10   CT Cervical Spine Wo Contrast Result Date: 11/22/2023 CLINICAL DATA:  Neck trauma (Age >= 65y); Head trauma, minor (Age >= 65y). EXAM: CT HEAD WITHOUT CONTRAST CT CERVICAL SPINE WITHOUT CONTRAST TECHNIQUE: Multidetector CT imaging of the head and cervical spine was performed following the standard  protocol without intravenous contrast. Multiplanar CT image reconstructions of the cervical spine were also generated. RADIATION DOSE REDUCTION: This exam was performed according to the departmental dose-optimization program which includes automated exposure control, adjustment of the mA and/or kV according to patient size and/or use of iterative reconstruction technique. COMPARISON:  CT head and cervical spine 11/12/2023. FINDINGS: CT HEAD FINDINGS Brain: No acute intracranial hemorrhage. Gray-white differentiation is preserved. No hydrocephalus or extra-axial collection. No mass effect or midline shift. Vascular: No hyperdense vessel or  unexpected calcification. Skull: No calvarial fracture or suspicious bone lesion. Skull base is unremarkable. Sinuses/Orbits: No acute finding. Other: Decreased size of the previously seen left frontal scalp hematoma. CT CERVICAL SPINE FINDINGS Alignment: Normal. Skull base and vertebrae: No acute fracture. Normal craniocervical junction. No suspicious bone lesions. Soft tissues and spinal canal: No prevertebral fluid or swelling. No visible canal hematoma. Disc levels: Unchanged mild cervical spondylosis without high-grade spinal canal stenosis. Upper chest: No acute findings. Other: None. IMPRESSION: 1. No acute intracranial abnormality. 2. No acute cervical spine fracture or traumatic listhesis. 3. Decreased size of the previously seen left frontal scalp hematoma. Electronically Signed   By: Orvan Falconer M.D.   On: 11/22/2023 19:10   DG Chest Portable 1 View Result Date: 11/22/2023 CLINICAL DATA:  Un witnessed fall EXAM: PORTABLE CHEST 1 VIEW COMPARISON:  04/27/2023 FINDINGS: Single frontal view of the chest demonstrates an unremarkable cardiac silhouette. No acute airspace disease, effusion, or pneumothorax. No acute bony abnormalities. IMPRESSION: 1. No acute intrathoracic process. Electronically Signed   By: Sharlet Salina M.D.   On: 11/22/2023 17:37   DG Pelvis Portable Result Date: 11/22/2023 CLINICAL DATA:  Un witnessed fall EXAM: PORTABLE PELVIS 1-2 VIEWS COMPARISON:  11/12/2023 FINDINGS: Frontal view of the pelvis includes both hips. Cannulated screws traversing a prior left subcapital femoral neck fracture again noted, unchanged. No new fracture, subluxation, or dislocation. Mild symmetrical bilateral hip osteoarthritis unchanged. The sacroiliac joints are normal. IMPRESSION: 1. Stable ORIF of a prior subcapital left femoral neck fracture. 2. No acute bony abnormalities. Electronically Signed   By: Sharlet Salina M.D.   On: 11/22/2023 17:36   DG Knee Complete 4 Views Left Result Date:  11/22/2023 CLINICAL DATA:  Un witnessed fall EXAM: LEFT KNEE - COMPLETE 4+ VIEW COMPARISON:  11/12/2023 FINDINGS: Frontal, bilateral oblique, and cross-table lateral views of the left knee are obtained. No evidence of acute displaced fracture. There is lateral subluxation of the patella without evidence of frank dislocation. If patellar subluxation or dislocation is clinically suspected, sunrise view of the left knee may be useful. Continue 3 compartmental osteoarthritis, greatest in the patellofemoral compartment. No evidence of joint effusion. Soft tissues are unremarkable. IMPRESSION: 1. Findings suspicious for lateral patellar subluxation. Sunrise view of the left knee may be useful. 2. Stable 3 compartmental osteoarthritis. 3. No acute fracture. Electronically Signed   By: Sharlet Salina M.D.   On: 11/22/2023 17:35    Procedures Procedures   Medications Ordered in ED Medications  LORazepam (ATIVAN) injection 0.5 mg (0.5 mg Intravenous Given 11/22/23 1922)  iohexol (OMNIPAQUE) 350 MG/ML injection 75 mL (75 mLs Intravenous Contrast Given 11/22/23 1858)  LORazepam (ATIVAN) injection 0.5 mg (0.5 mg Intravenous Given 11/22/23 2213)    ED Course/ Medical Decision Making/ A&P Clinical Course as of 11/22/23 2325  Sun Nov 22, 2023  [address] [place], [facility] [place]. At her baseline mentally. Unwitnessed fall. Wheelchair bound at baseline.  [RR]  1855 DG Pelvis Portable [RR]  2307 Spoke with  daughter, [name], and alerted her of imaging results and UTI. Will send paper prescription with facility.  [RR]    Clinical Course User Index [RR] Achille Rich, PA-C    Medical Decision Making Amount and/or Complexity of Data Reviewed Labs: ordered. Radiology: ordered. Decision-making details documented in ED Course.  Risk Prescription drug management.   78 y.o. female presents to the ER for evaluation of fall. Differential diagnosis includes but is not limited to trauma. Vital signs blood pressure 128/98, normal  pulse, normal respirations, satting well room air without increased work of breathing.  Temperature 98.42F. Physical exam as noted above.   I independently reviewed and interpreted the patient's labs.  CK within normal limits.  CBC shows slight leukocytosis at 14.8.  No anemia.  CBG within normal limits.  BMP shows mild decrease potassium 3.3.  Glucose at 107.  BUN at 26.  Otherwise electrolytes are within normal limits.  Urinalysis shows concentrated, amber and turbid urine.  Small amount of hemoglobin present.  This greater than 300 protein.  Moderate leukocytes greater than 50 white blood cells with few bacteria.  Urine culture pending.  CXR shows  1. No acute intrathoracic process.  XR Left knee shows  1. Findings suspicious for lateral patellar subluxation. Sunrise view of the left knee may be useful. 2. Stable 3 compartmental osteoarthritis. 3. No acute fracture.   DG pelvis 1. Stable ORIF of a prior subcapital left femoral neck fracture. 2. No acute bony abnormalities.  CT Head and C-spine 1. No acute intracranial abnormality. 2. No acute cervical spine fracture or traumatic listhesis. 3. Decreased size of the previously seen left frontal scalp hematoma.  CT AP imaging shows 1. No acute intrathoracic, intra-abdominal, intrapelvic traumatic injury.2. No acute fracture or traumatic malalignment of the thoracic or lumbar spine. 3. Air-fluid level within the urinary bladder lumen-correlate with recent instrumentation versus infection.  Potassium given for mild hypokalemia. Urinalysis consistent with UTI.  Urine culture pending.  I cannot elicit any painful response from palpation of any of her extremities, abdomen, pelvis.  Patient does have old scab present to the left forehead.  No other acute injury seen.  She does have some old bruising noted to her knees.  I ordered an x-ray of her knee given the bruising however did look old in appearance.  There was no significant tenderness palpation.  She is  able to straighten and flex her knee.  She is moving all extremities.  Discussed UTI diagnosis with daughter.  Reports that she is can be discharged home.  Will treat UTI with cefpodoxime.  I discussed this case with my attending who reviewed and evaluated the patient at bedside. Does not recommend any additional imaging or testing. The patient still does not have any complaints. Stable for discharge back to facility with antibiotics for UTI.   Portions of this report may have been transcribed using voice recognition software. Every effort was made to ensure accuracy; however, inadvertent computerized transcription errors may be present.   I discussed this case with my attending physician who cosigned this note including patient's presenting symptoms, physical exam, and planned diagnostics and interventions. Attending physician stated agreement with plan or made changes to plan which were implemented.   Attending physician assessed patient at bedside.  Final Clinical Impression(s) / ED Diagnoses Final diagnoses:  Fall, initial encounter  Acute cystitis without hematuria  Abnormal x-ray  Hypokalemia    Rx / DC Orders ED Discharge Orders  Ordered    cefpodoxime (VANTIN) 200 MG tablet  2 times daily        11/22/23 2331              Achille Rich, [place] 11/23/23 0003

## 2023-11-22 NOTE — ED Notes (Signed)
Pt to CT

## 2023-11-22 NOTE — Discharge Instructions (Addendum)
She was seen here today after a fall.  Does show that she may have some subluxation of her patella of her left knee.  Can follow-up outpatient about this given that she is not ambulatory.  Her lab work shows that she has a urinary tract infection.  I am treating her with antibiotic.  Please see the attached Antepar prescription.  If you have any concerns, new or worsening symptoms, please return to your nearest emergency room for evaluation.  Your potassium was mildly decreased here. Please follow up with your PCP for recheck in the next few days.   Contact a doctor if: You do not get better after 1-2 days. Your symptoms go away and then come back. Get help right away if: You have very bad back pain. You have very bad pain in your lower belly. You have a fever. You have chills. You feeling like you will vomit or you vomit.

## 2023-11-22 NOTE — ED Notes (Signed)
This RN unsuccessful at straight cath attempt, pt resistant towards straight cath; ED-PA Victory Dakin made aware.

## 2023-11-23 NOTE — ED Notes (Signed)
Attempted to call report tp [employer]. No answer from staff.

## 2024-02-02 ENCOUNTER — Encounter (HOSPITAL_COMMUNITY): Payer: Self-pay | Admitting: Emergency Medicine

## 2024-02-02 ENCOUNTER — Other Ambulatory Visit: Payer: Self-pay

## 2024-02-02 ENCOUNTER — Emergency Department (HOSPITAL_COMMUNITY)

## 2024-02-02 ENCOUNTER — Emergency Department (HOSPITAL_COMMUNITY)
Admission: EM | Admit: 2024-02-02 | Discharge: 2024-02-02 | Disposition: A | Discharge status: Home / Self Care | Attending: Emergency Medicine | Admitting: Emergency Medicine

## 2024-02-02 DIAGNOSIS — S0990XA Unspecified injury of head, initial encounter: Secondary | ICD-10-CM | POA: Diagnosis present

## 2024-02-02 DIAGNOSIS — Z79899 Other long term (current) drug therapy: Secondary | ICD-10-CM | POA: Diagnosis not present

## 2024-02-02 DIAGNOSIS — W1830XA Fall on same level, unspecified, initial encounter: Secondary | ICD-10-CM | POA: Diagnosis not present

## 2024-02-02 DIAGNOSIS — E876 Hypokalemia: Secondary | ICD-10-CM | POA: Diagnosis not present

## 2024-02-02 DIAGNOSIS — S0083XA Contusion of other part of head, initial encounter: Secondary | ICD-10-CM | POA: Insufficient documentation

## 2024-02-02 DIAGNOSIS — Z7982 Long term (current) use of aspirin: Secondary | ICD-10-CM | POA: Insufficient documentation

## 2024-02-02 DIAGNOSIS — F0390 Unspecified dementia without behavioral disturbance: Secondary | ICD-10-CM | POA: Insufficient documentation

## 2024-02-02 DIAGNOSIS — S0093XA Contusion of unspecified part of head, initial encounter: Secondary | ICD-10-CM

## 2024-02-02 DIAGNOSIS — W19XXXA Unspecified fall, initial encounter: Secondary | ICD-10-CM

## 2024-02-02 LAB — CBC
HCT: 38.1 % (ref 36.0–46.0)
Hemoglobin: 12.5 g/dL (ref 12.0–15.0)
MCH: 28.9 pg (ref 26.0–34.0)
MCHC: 32.8 g/dL (ref 30.0–36.0)
MCV: 88.2 fL (ref 80.0–100.0)
Platelets: 259 10*3/uL (ref 150–400)
RBC: 4.32 MIL/uL (ref 3.87–5.11)
RDW: 13.2 % (ref 11.5–15.5)
WBC: 9.3 10*3/uL (ref 4.0–10.5)
nRBC: 0 % (ref 0.0–0.2)

## 2024-02-02 LAB — BASIC METABOLIC PANEL
Anion gap: 10 (ref 5–15)
BUN: 29 mg/dL — ABNORMAL HIGH (ref 8–23)
CO2: 29 mmol/L (ref 22–32)
Calcium: 9.1 mg/dL (ref 8.9–10.3)
Chloride: 100 mmol/L (ref 98–111)
Creatinine, Ser: 0.62 mg/dL (ref 0.44–1.00)
GFR, Estimated: >60 mL/min (ref >60)
Glucose, Bld: 104 mg/dL — ABNORMAL HIGH (ref 70–99)
Potassium: 3.1 mmol/L — ABNORMAL LOW (ref 3.5–5.1)
Sodium: 139 mmol/L (ref 135–145)

## 2024-02-02 MED ORDER — SODIUM CHLORIDE 0.9 % IV BOLUS
500.0000 mL | Freq: Once | INTRAVENOUS | Status: AC
  Administered 2024-02-02: 500 mL via INTRAVENOUS

## 2024-02-02 MED ORDER — POTASSIUM CHLORIDE 20 MEQ PO PACK
20.0000 meq | PACK | Freq: Two times a day (BID) | ORAL | Status: AC
  Administered 2024-02-02: 20 meq via ORAL
  Filled 2024-02-02 (×2): qty 1

## 2024-02-02 MED ORDER — SODIUM CHLORIDE 0.9 % IV BOLUS: 1000.0000 mL | Freq: Once | INTRAVENOUS | Status: DC

## 2024-02-02 NOTE — Discharge Instructions (Addendum)
 The x-rays did not show any signs of serious injury.  The laboratory test did show your potassium was slightly decreased.  You were given a dose of potassium medication while you are in the ED.

## 2024-02-02 NOTE — ED Provider Notes (Signed)
 [place] EMERGENCY DEPARTMENT AT [facility] Provider Note   CSN: [id number] Arrival date & time: 02/02/24  1611     History  Chief Complaint  Patient presents with   Marletta Lor    Brooke Owens is a 78 y.o. female.   Fall     Patient resides at a memory care unit.  She has a history of depression hypercholesterolemia dementia.  Patient does have a history of frequent falls as noted from previous visits to the emergency department.  Patient reportedly had a fall at the nursing facility a couple hours ago.  She was sent to the ED for further evaluation.  Patient is currently at her mental baseline.  Patient does mumble quietly to me.  She states that she is fine.  Home Medications Prior to Admission medications   Medication Sig Start Date End Date Taking? Authorizing Provider  acetaminophen (TYLENOL) 650 MG CR tablet Take 650 mg by mouth every 6 (six) hours as needed for pain or fever (headache).    [provider]  aspirin EC 325 MG tablet Take 325 mg by mouth daily.    [provider]  atorvastatin (LIPITOR) 20 MG tablet Take 1 tablet (20 mg total) by mouth every other day. 07/14/19   Philip Aspen, Limmie Patricia, MD  FLUoxetine (PROZAC) 20 MG capsule Take 20 mg by mouth daily.    [provider]  hydrochlorothiazide (HYDRODIURIL) 25 MG tablet Take 25 mg by mouth daily.    [provider]  levothyroxine (SYNTHROID) 88 MCG tablet Take 88 mcg by mouth daily before breakfast.    [provider]  LORazepam (ATIVAN) 0.5 MG tablet Take 0.5 mg by mouth every 6 (six) hours as needed for anxiety.    [provider]  Wound Cleansers (SIMPLY SALINE WOUND WASH SPRAY EX) Apply 1 application  topically See admin instructions. Apply to treat L calf. Cleanse with NS, apply Xeroform and wrap with Kerlix and Coban toes to knee twice a week.    [provider]      Allergies    Demerol [meperidine hcl]    Review of Systems    Review of Systems  Physical Exam Updated Vital Signs BP (!) 102/50   Pulse 67   Temp 97.8 F (36.6 C) (Axillary)   Resp 14   SpO2 99%  Physical Exam Vitals and nursing note reviewed.  Constitutional:      Appearance: She is well-developed. She is not diaphoretic.  HENT:     Head: Normocephalic.     Comments: Hematoma left forehead, periorbital temporal region, c-collar in place    Right Ear: External ear normal.     Left Ear: External ear normal.  Eyes:     General: No scleral icterus.       Right eye: No discharge.        Left eye: No discharge.     Conjunctiva/sclera: Conjunctivae normal.  Neck:     Trachea: No tracheal deviation.  Cardiovascular:     Rate and Rhythm: Normal rate and regular rhythm.  Pulmonary:     Effort: Pulmonary effort is normal. No respiratory distress.     Breath sounds: Normal breath sounds. No stridor. No wheezing or rales.  Abdominal:     General: Bowel sounds are normal. There is no distension.     Palpations: Abdomen is soft.     Tenderness: There is no abdominal tenderness. There is no guarding or rebound.  Musculoskeletal:  General: No tenderness or deformity.     Cervical back: Neck supple.     Comments: No tenderness palpation bilateral upper extremities lower extremities, no spinal tenderness  Skin:    General: Skin is warm and dry.     Findings: No rash.  Neurological:     General: No focal deficit present.     Mental Status: She is alert.     GCS: GCS eye subscore is 4. GCS verbal subscore is 4. GCS motor subscore is 6.     Cranial Nerves: No facial asymmetry.     Sensory: No sensory deficit.     Motor: Abnormal muscle tone present. No seizure activity.     Coordination: Coordination normal.     Comments: Speaks quietly, patient is confused, arms are held against her chest and flexion (per EMS report this is the patient's baseline)  Psychiatric:        Mood and Affect: Mood normal.     ED Results / Procedures /  Treatments   Labs (all labs ordered are listed, but only abnormal results are displayed) Labs Reviewed  BASIC METABOLIC PANEL - Abnormal; Notable for the following components:      Result Value   Potassium 3.1 (*)    Glucose, Bld 104 (*)    BUN 29 (*)    All other components within normal limits  URINE CULTURE  CBC    EKG EKG Interpretation Date/Time:  Tuesday February 02 2024 20:32:42 EDT Ventricular Rate:  63 PR Interval:  142 QRS Duration:  89 QT Interval:  524 QTC Calculation: 537 R Axis:   -40  Text Interpretation: Sinus rhythm Left axis deviation Low voltage, precordial leads Abnormal R-wave progression, late transition Borderline abnrm T, anterolateral leads Prolonged QT interval Confirmed by Linwood Dibbles [phone]) on 02/02/2024 8:36:21 PM  Radiology CT Head Wo Contrast Result Date: 02/02/2024 CLINICAL DATA:  Provided history: Head trauma, minor. Facial trauma, blunt. Neck trauma. Additional history provided: Weakness fall (with head trauma), on aspirin. EXAM: CT HEAD WITHOUT CONTRAST CT MAXILLOFACIAL WITHOUT CONTRAST CT CERVICAL SPINE WITHOUT CONTRAST TECHNIQUE: Multidetector CT imaging of the head, cervical spine, and maxillofacial structures were performed using the standard protocol without intravenous contrast. Multiplanar CT image reconstructions of the cervical spine and maxillofacial structures were also generated. RADIATION DOSE REDUCTION: This exam was performed according to the departmental dose-optimization program which includes automated exposure control, adjustment of the mA and/or kV according to patient size and/or use of iterative reconstruction technique. COMPARISON:  Head CT 11/02/2023. Cervical spine CT 11/22/2023. Maxillofacial CT 02/02/2023. FINDINGS: CT HEAD FINDINGS Brain: Generalized cerebral atrophy. Prominence of the ventricles and sulci, which appears symmetric. There is no acute intracranial hemorrhage. No demarcated cortical infarct. No extra-axial fluid  collection. No evidence of an intracranial mass. No midline shift. Vascular: No hyperdense vessel.  Atherosclerotic calcifications. Skull: No calvarial fracture or aggressive osseous lesion. Other: Left anterior scalp and periorbital hematoma. CT MAXILLOFACIAL FINDINGS Osseous: No acute maxillofacial fracture is identified. Chronic fracture deformity of the left orbital floor (with slight depression), unchanged from the prior head CT of 11/02/2023 (series 11, image 30). Orbits: Left anterior scalp/periorbital hematoma. No acute finding within the orbits. Sinuses: Small volume frothy secretions, and mild mucosal thickening, within the bowel maxillary sinuses. Mild mucosal thickening, and small fluid level, within the right sphenoid sinus. Moderate bilateral ethmoid sinusitis. Soft tissues: Left anterior scalp and periorbital hematoma. CT CERVICAL SPINE FINDINGS Alignment: 2 mm C2-C3 grade 1 retrolisthesis. Slight grade 1 anterolisthesis at  C7-T1 and T1-T2. Skull base and vertebrae: The basion-dental and atlanto-dental intervals are maintained.No evidence of acute fracture to the cervical spine. Soft tissues and spinal canal: No prevertebral fluid or swelling. No visible canal hematoma. Disc levels: Cervical spondylosis with multilevel disc space narrowing, disc bulges/disc protrusions, posterior disc osteophyte complexes and endplate spurring. No appreciable high-grade spinal canal stenosis. Multilevel bony neural foraminal narrowing. Upper chest: No consolidation within the imaged lung apices. No visible pneumothorax. IMPRESSION: CT head: 1.  No evidence of an acute intracranial abnormality. 2. Left anterior scalp/periorbital hematoma. 3. Cerebral atrophy. CT maxillofacial: 1. No evidence of an acute maxillofacial fracture. 2. Left anterior scalp/periorbital hematoma. 3. Chronic fracture deformity of the left orbital floor. 4. Paranasal sinus disease as described. CT cervical spine: 1. No evidence of an acute  cervical spine fracture. 2. Mild grade 1 spondylolisthesis at C2-C3, C7-T1 and T1-T2, unchanged from the prior cervical spine CT of 11/22/2023. 3. Cervical spondylosis as described. Electronically Signed   By: Jackey Loge D.O.   On: 02/02/2024 18:32   CT Cervical Spine Wo Contrast Result Date: 02/02/2024 CLINICAL DATA:  Provided history: Head trauma, minor. Facial trauma, blunt. Neck trauma. Additional history provided: Weakness fall (with head trauma), on aspirin. EXAM: CT HEAD WITHOUT CONTRAST CT MAXILLOFACIAL WITHOUT CONTRAST CT CERVICAL SPINE WITHOUT CONTRAST TECHNIQUE: Multidetector CT imaging of the head, cervical spine, and maxillofacial structures were performed using the standard protocol without intravenous contrast. Multiplanar CT image reconstructions of the cervical spine and maxillofacial structures were also generated. RADIATION DOSE REDUCTION: This exam was performed according to the departmental dose-optimization program which includes automated exposure control, adjustment of the mA and/or kV according to patient size and/or use of iterative reconstruction technique. COMPARISON:  Head CT 11/02/2023. Cervical spine CT 11/22/2023. Maxillofacial CT 02/02/2023. FINDINGS: CT HEAD FINDINGS Brain: Generalized cerebral atrophy. Prominence of the ventricles and sulci, which appears symmetric. There is no acute intracranial hemorrhage. No demarcated cortical infarct. No extra-axial fluid collection. No evidence of an intracranial mass. No midline shift. Vascular: No hyperdense vessel.  Atherosclerotic calcifications. Skull: No calvarial fracture or aggressive osseous lesion. Other: Left anterior scalp and periorbital hematoma. CT MAXILLOFACIAL FINDINGS Osseous: No acute maxillofacial fracture is identified. Chronic fracture deformity of the left orbital floor (with slight depression), unchanged from the prior head CT of 11/02/2023 (series 11, image 30). Orbits: Left anterior scalp/periorbital hematoma.  No acute finding within the orbits. Sinuses: Small volume frothy secretions, and mild mucosal thickening, within the bowel maxillary sinuses. Mild mucosal thickening, and small fluid level, within the right sphenoid sinus. Moderate bilateral ethmoid sinusitis. Soft tissues: Left anterior scalp and periorbital hematoma. CT CERVICAL SPINE FINDINGS Alignment: 2 mm C2-C3 grade 1 retrolisthesis. Slight grade 1 anterolisthesis at C7-T1 and T1-T2. Skull base and vertebrae: The basion-dental and atlanto-dental intervals are maintained.No evidence of acute fracture to the cervical spine. Soft tissues and spinal canal: No prevertebral fluid or swelling. No visible canal hematoma. Disc levels: Cervical spondylosis with multilevel disc space narrowing, disc bulges/disc protrusions, posterior disc osteophyte complexes and endplate spurring. No appreciable high-grade spinal canal stenosis. Multilevel bony neural foraminal narrowing. Upper chest: No consolidation within the imaged lung apices. No visible pneumothorax. IMPRESSION: CT head: 1.  No evidence of an acute intracranial abnormality. 2. Left anterior scalp/periorbital hematoma. 3. Cerebral atrophy. CT maxillofacial: 1. No evidence of an acute maxillofacial fracture. 2. Left anterior scalp/periorbital hematoma. 3. Chronic fracture deformity of the left orbital floor. 4. Paranasal sinus disease as described. CT cervical spine: 1.  No evidence of an acute cervical spine fracture. 2. Mild grade 1 spondylolisthesis at C2-C3, C7-T1 and T1-T2, unchanged from the prior cervical spine CT of 11/22/2023. 3. Cervical spondylosis as described. Electronically Signed   By: Jackey Loge D.O.   On: 02/02/2024 18:32   CT Maxillofacial WO CM Result Date: 02/02/2024 CLINICAL DATA:  Provided history: Head trauma, minor. Facial trauma, blunt. Neck trauma. Additional history provided: Weakness fall (with head trauma), on aspirin. EXAM: CT HEAD WITHOUT CONTRAST CT MAXILLOFACIAL WITHOUT CONTRAST  CT CERVICAL SPINE WITHOUT CONTRAST TECHNIQUE: Multidetector CT imaging of the head, cervical spine, and maxillofacial structures were performed using the standard protocol without intravenous contrast. Multiplanar CT image reconstructions of the cervical spine and maxillofacial structures were also generated. RADIATION DOSE REDUCTION: This exam was performed according to the departmental dose-optimization program which includes automated exposure control, adjustment of the mA and/or kV according to patient size and/or use of iterative reconstruction technique. COMPARISON:  Head CT 11/02/2023. Cervical spine CT 11/22/2023. Maxillofacial CT 02/02/2023. FINDINGS: CT HEAD FINDINGS Brain: Generalized cerebral atrophy. Prominence of the ventricles and sulci, which appears symmetric. There is no acute intracranial hemorrhage. No demarcated cortical infarct. No extra-axial fluid collection. No evidence of an intracranial mass. No midline shift. Vascular: No hyperdense vessel.  Atherosclerotic calcifications. Skull: No calvarial fracture or aggressive osseous lesion. Other: Left anterior scalp and periorbital hematoma. CT MAXILLOFACIAL FINDINGS Osseous: No acute maxillofacial fracture is identified. Chronic fracture deformity of the left orbital floor (with slight depression), unchanged from the prior head CT of 11/02/2023 (series 11, image 30). Orbits: Left anterior scalp/periorbital hematoma. No acute finding within the orbits. Sinuses: Small volume frothy secretions, and mild mucosal thickening, within the bowel maxillary sinuses. Mild mucosal thickening, and small fluid level, within the right sphenoid sinus. Moderate bilateral ethmoid sinusitis. Soft tissues: Left anterior scalp and periorbital hematoma. CT CERVICAL SPINE FINDINGS Alignment: 2 mm C2-C3 grade 1 retrolisthesis. Slight grade 1 anterolisthesis at C7-T1 and T1-T2. Skull base and vertebrae: The basion-dental and atlanto-dental intervals are maintained.No  evidence of acute fracture to the cervical spine. Soft tissues and spinal canal: No prevertebral fluid or swelling. No visible canal hematoma. Disc levels: Cervical spondylosis with multilevel disc space narrowing, disc bulges/disc protrusions, posterior disc osteophyte complexes and endplate spurring. No appreciable high-grade spinal canal stenosis. Multilevel bony neural foraminal narrowing. Upper chest: No consolidation within the imaged lung apices. No visible pneumothorax. IMPRESSION: CT head: 1.  No evidence of an acute intracranial abnormality. 2. Left anterior scalp/periorbital hematoma. 3. Cerebral atrophy. CT maxillofacial: 1. No evidence of an acute maxillofacial fracture. 2. Left anterior scalp/periorbital hematoma. 3. Chronic fracture deformity of the left orbital floor. 4. Paranasal sinus disease as described. CT cervical spine: 1. No evidence of an acute cervical spine fracture. 2. Mild grade 1 spondylolisthesis at C2-C3, C7-T1 and T1-T2, unchanged from the prior cervical spine CT of 11/22/2023. 3. Cervical spondylosis as described. Electronically Signed   By: Jackey Loge D.O.   On: 02/02/2024 18:32   DG Chest Portable 1 View Result Date: 02/02/2024 CLINICAL DATA:  Fall. EXAM: PORTABLE CHEST 1 VIEW COMPARISON:  11/22/2023 FINDINGS: The cardiomediastinal contours are normal. Benign calcified granuloma in the right lung. Pulmonary vasculature is normal. No consolidation, pleural effusion, or pneumothorax. No acute osseous abnormalities are seen. Peripherally calcified left breast implant. IMPRESSION: No acute chest findings. Electronically Signed   By: Narda Rutherford M.D.   On: 02/02/2024 17:51   DG Hip Unilat W or Wo Pelvis 2-3 Views Right  Result Date: 02/02/2024 CLINICAL DATA:  Pain after fall. EXAM: DG HIP (WITH OR WITHOUT PELVIS) 2-3V RIGHT; DG HIP (WITH OR WITHOUT PELVIS) 2-3V LEFT COMPARISON:  None Available. FINDINGS: No acute fracture of the pelvis or hips. Previous pinning of left  femoral neck fracture. Hardware is intact. Moderate bilateral hip osteoarthritis. Pubic rami are intact. Pubic symphysis and sacroiliac joints are congruent. IMPRESSION: 1. No acute fracture of the pelvis or hips. 2. Moderate bilateral hip osteoarthritis. Electronically Signed   By: Narda Rutherford M.D.   On: 02/02/2024 17:50   DG Hip Unilat W or Wo Pelvis 2-3 Views Left Result Date: 02/02/2024 CLINICAL DATA:  Pain after fall. EXAM: DG HIP (WITH OR WITHOUT PELVIS) 2-3V RIGHT; DG HIP (WITH OR WITHOUT PELVIS) 2-3V LEFT COMPARISON:  None Available. FINDINGS: No acute fracture of the pelvis or hips. Previous pinning of left femoral neck fracture. Hardware is intact. Moderate bilateral hip osteoarthritis. Pubic rami are intact. Pubic symphysis and sacroiliac joints are congruent. IMPRESSION: 1. No acute fracture of the pelvis or hips. 2. Moderate bilateral hip osteoarthritis. Electronically Signed   By: Narda Rutherford M.D.   On: 02/02/2024 17:50    Procedures Procedures    Medications Ordered in ED Medications  sodium chloride 0.9 % bolus 500 mL (500 mLs Intravenous New Bag/Given 02/02/24 1835)  potassium chloride (KLOR-CON) packet 20 mEq (20 mEq Oral Given 02/02/24 2005)    ED Course/ Medical Decision Making/ A&P Clinical Course as of 02/02/24 2112  Tue Feb 02, 2024  1815 No acute findings on chest x-ray [JK]  1816 Hip films without signs of fracture, osteoarthritis noted [JK]  1914 Head CT and C-spine CT without intracranial abnormality.  Patient has a periorbital hematoma [JK]    Clinical Course User Index [JK] Linwood Dibbles, MD                                 Medical Decision Making Problems Addressed: Contusion of head, unspecified part of head, initial encounter: acute illness or injury that poses a threat to life or bodily functions Fall, initial encounter: acute illness or injury that poses a threat to life or bodily functions Hypokalemia: chronic illness or injury  Amount and/or  Complexity of Data Reviewed Labs: ordered. Decision-making details documented in ED Course. Radiology: ordered and independent interpretation performed.  Risk Prescription drug management.   Patient presented to the ED for evaluation after a witnessed fall at the nursing facility during patient care.  Patient with notable contusion to her left forehead and periorbital region.  Patient with diminished mental capacity at baseline.  Per nursing facility no changes from her baseline.  ED workup overall reassuring.  Patient does not have signs of severe dehydration.  She is not anemic.  No leukocytosis to suggest occult infection.  Urine specimen was sent but not enough to perform a urinalysis.  Culture has been obtained.  X-rays of her hips maxillofacial head C-spine all negative for any acute injury.  Patient appears stable for discharge back to the nursing facility        Final Clinical Impression(s) / ED Diagnoses Final diagnoses:  Fall, initial encounter  Hypokalemia  Contusion of head, unspecified part of head, initial encounter    Rx / DC Orders ED Discharge Orders     None         Linwood Dibbles, MD 02/02/24 2113

## 2024-02-02 NOTE — ED Triage Notes (Signed)
 [facility] for a witnessed fall during patient care. Patient was left on the ground for 2 hours. The left side of her head was hit. She takes aspirin and is currently at baseline.   EMS vitals: 95/40 BP 90's% SPO2 60-70 P 122 CBG

## 2024-02-04 LAB — URINE CULTURE: Culture: >=100000 — AB

## 2024-02-05 ENCOUNTER — Telehealth (HOSPITAL_BASED_OUTPATIENT_CLINIC_OR_DEPARTMENT_OTHER): Payer: Self-pay

## 2024-02-05 NOTE — Telephone Encounter (Signed)
 Post ED Visit - Positive Culture Follow-up: Successful Patient Follow-Up  Culture assessed and recommendations reviewed by:  []  Enzo Bi, Pharm.D. [x]  Celedonio Miyamoto, Pharm.D., BCPS AQ-ID []  Garvin Fila, Pharm.D., BCPS []  Georgina Pillion, Pharm.D., BCPS []  [place], [address].D., BCPS, AAHIVP []  Estella Husk, Pharm.D., BCPS, AAHIVP []  Lysle Pearl, PharmD, BCPS []  Phillips Climes, PharmD, BCPS []  Agapito Games, PharmD, BCPS []  Verlan Friends, PharmD  Positive urine culture  [x]  Patient discharged without antimicrobial prescription and treatment is now indicated []  Organism is resistant to prescribed ED discharge antimicrobial []  Patient with positive blood cultures  Changes discussed with ED provider: Anders Simmonds DO New antibiotic prescription Macrobid 100 mg po BID x 5 days Called and faxed to [facility] : [facility] RN at [facility], date 02/05/24, time 11:00 am    Sandria Senter 02/05/2024, 11:04 AM

## 2024-02-22 ENCOUNTER — Emergency Department (HOSPITAL_COMMUNITY)

## 2024-02-22 ENCOUNTER — Inpatient Hospital Stay (HOSPITAL_COMMUNITY)
Admission: EM | Admit: 2024-02-22 | Discharge: 2024-02-25 | DRG: 884 | Disposition: A | Discharge status: Hospice | Source: Skilled Nursing Facility | Attending: Internal Medicine | Admitting: Internal Medicine

## 2024-02-22 ENCOUNTER — Other Ambulatory Visit: Payer: Self-pay

## 2024-02-22 ENCOUNTER — Encounter (HOSPITAL_COMMUNITY): Payer: Self-pay | Admitting: Emergency Medicine

## 2024-02-22 DIAGNOSIS — F0390 Unspecified dementia without behavioral disturbance: Secondary | ICD-10-CM | POA: Diagnosis not present

## 2024-02-22 DIAGNOSIS — E039 Hypothyroidism, unspecified: Secondary | ICD-10-CM | POA: Diagnosis present

## 2024-02-22 DIAGNOSIS — G9340 Encephalopathy, unspecified: Secondary | ICD-10-CM | POA: Diagnosis present

## 2024-02-22 DIAGNOSIS — Z66 Do not resuscitate: Secondary | ICD-10-CM | POA: Diagnosis present

## 2024-02-22 DIAGNOSIS — Z7982 Long term (current) use of aspirin: Secondary | ICD-10-CM

## 2024-02-22 DIAGNOSIS — E876 Hypokalemia: Secondary | ICD-10-CM | POA: Diagnosis present

## 2024-02-22 DIAGNOSIS — Z6829 Body mass index (BMI) 29.0-29.9, adult: Secondary | ICD-10-CM

## 2024-02-22 DIAGNOSIS — T502X5A Adverse effect of carbonic-anhydrase inhibitors, benzothiadiazides and other diuretics, initial encounter: Secondary | ICD-10-CM | POA: Diagnosis present

## 2024-02-22 DIAGNOSIS — Z79890 Hormone replacement therapy (postmenopausal): Secondary | ICD-10-CM

## 2024-02-22 DIAGNOSIS — F32A Depression, unspecified: Secondary | ICD-10-CM | POA: Diagnosis present

## 2024-02-22 DIAGNOSIS — Z90710 Acquired absence of both cervix and uterus: Secondary | ICD-10-CM

## 2024-02-22 DIAGNOSIS — R4182 Altered mental status, unspecified: Secondary | ICD-10-CM | POA: Diagnosis not present

## 2024-02-22 DIAGNOSIS — Z515 Encounter for palliative care: Secondary | ICD-10-CM

## 2024-02-22 DIAGNOSIS — Z87440 Personal history of urinary (tract) infections: Secondary | ICD-10-CM

## 2024-02-22 DIAGNOSIS — E7800 Pure hypercholesterolemia, unspecified: Secondary | ICD-10-CM | POA: Diagnosis present

## 2024-02-22 DIAGNOSIS — R64 Cachexia: Secondary | ICD-10-CM | POA: Diagnosis present

## 2024-02-22 DIAGNOSIS — Z888 Allergy status to other drugs, medicaments and biological substances status: Secondary | ICD-10-CM

## 2024-02-22 DIAGNOSIS — R296 Repeated falls: Secondary | ICD-10-CM | POA: Diagnosis present

## 2024-02-22 DIAGNOSIS — Z79899 Other long term (current) drug therapy: Secondary | ICD-10-CM

## 2024-02-22 DIAGNOSIS — R531 Weakness: Secondary | ICD-10-CM | POA: Diagnosis present

## 2024-02-22 LAB — COMPREHENSIVE METABOLIC PANEL WITH GFR
ALT: 20 U/L (ref 0–44)
AST: 37 U/L (ref 15–41)
Albumin: 3.1 g/dL — ABNORMAL LOW (ref 3.5–5.0)
Alkaline Phosphatase: 65 U/L (ref 38–126)
Anion gap: 6 (ref 5–15)
BUN: 30 mg/dL — ABNORMAL HIGH (ref 8–23)
CO2: 31 mmol/L (ref 22–32)
Calcium: 8.7 mg/dL — ABNORMAL LOW (ref 8.9–10.3)
Chloride: 102 mmol/L (ref 98–111)
Creatinine, Ser: 0.71 mg/dL (ref 0.44–1.00)
GFR, Estimated: >60 mL/min (ref >60)
Glucose, Bld: 88 mg/dL (ref 70–99)
Potassium: 2.5 mmol/L — CL (ref 3.5–5.1)
Sodium: 139 mmol/L (ref 135–145)
Total Bilirubin: 1.3 mg/dL — ABNORMAL HIGH (ref 0.0–1.2)
Total Protein: 5.8 g/dL — ABNORMAL LOW (ref 6.5–8.1)

## 2024-02-22 LAB — CBC
HCT: 34.8 % — ABNORMAL LOW (ref 36.0–46.0)
Hemoglobin: 11.2 g/dL — ABNORMAL LOW (ref 12.0–15.0)
MCH: 28.9 pg (ref 26.0–34.0)
MCHC: 32.2 g/dL (ref 30.0–36.0)
MCV: 89.9 fL (ref 80.0–100.0)
Platelets: 252 10*3/uL (ref 150–400)
RBC: 3.87 MIL/uL (ref 3.87–5.11)
RDW: 13.7 % (ref 11.5–15.5)
WBC: 6.6 10*3/uL (ref 4.0–10.5)
nRBC: 0 % (ref 0.0–0.2)

## 2024-02-22 LAB — CBG MONITORING, ED: Glucose-Capillary: 89 mg/dL (ref 70–99)

## 2024-02-22 MED ORDER — POTASSIUM CHLORIDE 10 MEQ/100ML IV SOLN
10.0000 meq | Freq: Once | INTRAVENOUS | Status: AC
  Administered 2024-02-23: 10 meq via INTRAVENOUS
  Filled 2024-02-22: qty 100

## 2024-02-22 MED ORDER — SODIUM CHLORIDE 0.9 % IV BOLUS
1000.0000 mL | Freq: Once | INTRAVENOUS | Status: AC
  Administered 2024-02-22: 1000 mL via INTRAVENOUS

## 2024-02-22 NOTE — ED Triage Notes (Signed)
 Pt arrived from a facility for AMS. Pt has hx of dementia and is nonverbal.  BP106/40; P2 95% HR 58 cbg 115

## 2024-02-22 NOTE — ED Provider Notes (Signed)
 [place] EMERGENCY DEPARTMENT AT [facility] Provider Note   CSN: [id number] Arrival date & time: 02/22/24  2205     History  Chief Complaint  Patient presents with   Altered Mental Status    Brooke Owens is a 78 y.o. female.  Patient to ED from memory care facility, sent by EMS for altered mental status. Per EMS report, the patient has a history of dementia and is nonverbal at baseline but today has been less responsive than her usual. No fever. No known fall. No cough reported or vomiting.   The history is provided by the patient. No language interpreter was used.  Altered Mental Status      Home Medications Prior to Admission medications   Medication Sig Start Date End Date Taking? Authorizing Provider  acetaminophen (TYLENOL) 650 MG CR tablet Take 650 mg by mouth every 6 (six) hours as needed for pain or fever (headache).    [provider]  aspirin EC 325 MG tablet Take 325 mg by mouth daily.    [provider]  atorvastatin (LIPITOR) 20 MG tablet Take 1 tablet (20 mg total) by mouth every other day. 07/14/19   Philip Aspen, Limmie Patricia, MD  FLUoxetine (PROZAC) 20 MG capsule Take 20 mg by mouth daily.    [provider]  hydrochlorothiazide (HYDRODIURIL) 25 MG tablet Take 25 mg by mouth daily.    [provider]  levothyroxine (SYNTHROID) 88 MCG tablet Take 88 mcg by mouth daily before breakfast.    [provider]  LORazepam (ATIVAN) 0.5 MG tablet Take 0.5 mg by mouth every 6 (six) hours as needed for anxiety.    [provider]  Wound Cleansers (SIMPLY SALINE WOUND WASH SPRAY EX) Apply 1 application  topically See admin instructions. Apply to treat L calf. Cleanse with NS, apply Xeroform and wrap with Kerlix and Coban toes to knee twice a week.    [provider]      Allergies    Demerol [meperidine hcl]    Review of Systems   Review of Systems  Physical Exam Updated Vital Signs BP (!)  101/50 (BP Location: Right Arm)   Pulse 62   Temp 98.3 F (36.8 C)   Resp 16   Ht 5\' 4"  (1.626 m)   Wt 77.1 kg   SpO2 96%   BMI 29.18 kg/m  Physical Exam Vitals and nursing note reviewed.  Constitutional:      Comments: Frail, elderly patient  HENT:     Mouth/Throat:     Mouth: Mucous membranes are dry.     Comments: Dry, chapped lips Cardiovascular:     Rate and Rhythm: Normal rate and regular rhythm.  Pulmonary:     Effort: Pulmonary effort is normal.     Breath sounds: No wheezing or rales.     Comments: Poor effort Past breast implants present Chest:     Chest wall: No tenderness.  Abdominal:     General: There is no distension.     Palpations: Abdomen is soft.  Musculoskeletal:     Comments: No contractures. Bilateral LE edema without pitting. Bruising to right dorsal foot without bony deformity - acute in appearance.  Skin:    General: Skin is warm and dry.     Comments: Aged bruise to forehead. Newer appearing bruise to lateral left eye. No facial swelling.  Neurological:     GCS: GCS eye subscore is 3. GCS verbal subscore is 4. GCS motor subscore  is 5.     Comments: Does not follow command.      ED Results / Procedures / Treatments   Labs (all labs ordered are listed, but only abnormal results are displayed) Labs Reviewed  COMPREHENSIVE METABOLIC PANEL WITH GFR - Abnormal; Notable for the following components:      Result Value   Potassium 2.5 (*)    BUN 30 (*)    Calcium 8.7 (*)    Total Protein 5.8 (*)    Albumin 3.1 (*)    Total Bilirubin 1.3 (*)    All other components within normal limits  CBC - Abnormal; Notable for the following components:   Hemoglobin 11.2 (*)    HCT 34.8 (*)    All other components within normal limits  URINALYSIS, ROUTINE W REFLEX MICROSCOPIC  MAGNESIUM  CBG MONITORING, ED    EKG None  Radiology DG Foot Complete Right Result Date: 02/22/2024 CLINICAL DATA:  Injury. Demineralization. Dementia, altered mental  status, nonverbal patient. EXAM: RIGHT FOOT COMPLETE - 3+ VIEW COMPARISON:  None Available. FINDINGS: No acute fracture or dislocation. The phalanges are not well evaluated due to plantar flexion. No radiographic evidence of osteomyelitis. Degenerative changes about the midfoot and IP joints. Calcaneal spur. Soft tissues are radiographically unremarkable. IMPRESSION: Negative. Electronically Signed   By: Minerva Fester M.D.   On: 02/22/2024 23:25   DG Chest Portable 1 View Result Date: 02/22/2024 CLINICAL DATA:  Altered level of consciousness, dementia EXAM: PORTABLE CHEST 1 VIEW COMPARISON:  02/02/2024 FINDINGS: Single frontal view of the chest demonstrates a stable cardiac silhouette. No acute airspace disease, effusion, or pneumothorax. Stable right upper lobe calcified granuloma. No acute bony abnormalities. Stable calcified left breast implant. IMPRESSION: 1. Stable exam, no acute process. Electronically Signed   By: Sharlet Salina M.D.   On: 02/22/2024 23:22    Procedures Procedures    Medications Ordered in ED Medications  potassium chloride 10 mEq in 100 mL IVPB (has no administration in time range)  sodium chloride 0.9 % bolus 1,000 mL (1,000 mLs Intravenous New Bag/Given 02/22/24 2315)    ED Course/ Medical Decision Making/ A&P Clinical Course as of 02/22/24 2335  Mon Feb 22, 2024  2328 Patient to ED from ?[facility]. Unsuccessful in contacting facility for additional information. Essentially, mental status altered from her baseline. No reported ss/sxs illness, acute fall. Per records sent with her, she is a Full Code. Labs pending for altered mental status. Hypokalemia of 2.5 resulted. IV K+ ordered.  [SU]  2331 Patient care signed out to Ivar Drape, PA-C pending review of labs and disposition.  [SU]    Clinical Course User Index [SU] Elpidio Anis, PA-C                                 Medical Decision Making Amount and/or Complexity of Data Reviewed Labs:  ordered. Radiology: ordered.           Final Clinical Impression(s) / ED Diagnoses Final diagnoses:  Altered mental status, unspecified altered mental status type    Rx / DC Orders ED Discharge Orders     None         Elpidio Anis, PA-C 02/22/24 2335    Anders Simmonds T, DO 02/26/24 1503

## 2024-02-22 NOTE — ED Notes (Signed)
 Unsuccessful with in and out catheter.

## 2024-02-23 ENCOUNTER — Emergency Department (HOSPITAL_COMMUNITY)

## 2024-02-23 ENCOUNTER — Other Ambulatory Visit: Payer: Self-pay

## 2024-02-23 DIAGNOSIS — R4182 Altered mental status, unspecified: Secondary | ICD-10-CM | POA: Diagnosis present

## 2024-02-23 DIAGNOSIS — Z7982 Long term (current) use of aspirin: Secondary | ICD-10-CM | POA: Diagnosis not present

## 2024-02-23 DIAGNOSIS — T502X5A Adverse effect of carbonic-anhydrase inhibitors, benzothiadiazides and other diuretics, initial encounter: Secondary | ICD-10-CM | POA: Diagnosis present

## 2024-02-23 DIAGNOSIS — R531 Weakness: Secondary | ICD-10-CM | POA: Diagnosis present

## 2024-02-23 DIAGNOSIS — R296 Repeated falls: Secondary | ICD-10-CM | POA: Diagnosis present

## 2024-02-23 DIAGNOSIS — Z7189 Other specified counseling: Secondary | ICD-10-CM | POA: Diagnosis not present

## 2024-02-23 DIAGNOSIS — E876 Hypokalemia: Secondary | ICD-10-CM | POA: Diagnosis present

## 2024-02-23 DIAGNOSIS — Z515 Encounter for palliative care: Secondary | ICD-10-CM | POA: Diagnosis not present

## 2024-02-23 DIAGNOSIS — G9340 Encephalopathy, unspecified: Secondary | ICD-10-CM | POA: Diagnosis present

## 2024-02-23 DIAGNOSIS — Z90710 Acquired absence of both cervix and uterus: Secondary | ICD-10-CM | POA: Diagnosis not present

## 2024-02-23 DIAGNOSIS — Z888 Allergy status to other drugs, medicaments and biological substances status: Secondary | ICD-10-CM | POA: Diagnosis not present

## 2024-02-23 DIAGNOSIS — Z66 Do not resuscitate: Secondary | ICD-10-CM | POA: Diagnosis present

## 2024-02-23 DIAGNOSIS — Z87440 Personal history of urinary (tract) infections: Secondary | ICD-10-CM | POA: Diagnosis not present

## 2024-02-23 DIAGNOSIS — E039 Hypothyroidism, unspecified: Secondary | ICD-10-CM | POA: Diagnosis present

## 2024-02-23 DIAGNOSIS — R64 Cachexia: Secondary | ICD-10-CM | POA: Diagnosis present

## 2024-02-23 DIAGNOSIS — F03C0 Unspecified dementia, severe, without behavioral disturbance, psychotic disturbance, mood disturbance, and anxiety: Secondary | ICD-10-CM | POA: Diagnosis not present

## 2024-02-23 DIAGNOSIS — E7800 Pure hypercholesterolemia, unspecified: Secondary | ICD-10-CM | POA: Diagnosis present

## 2024-02-23 DIAGNOSIS — F0390 Unspecified dementia without behavioral disturbance: Secondary | ICD-10-CM | POA: Diagnosis present

## 2024-02-23 DIAGNOSIS — Z79899 Other long term (current) drug therapy: Secondary | ICD-10-CM | POA: Diagnosis not present

## 2024-02-23 DIAGNOSIS — Z79890 Hormone replacement therapy (postmenopausal): Secondary | ICD-10-CM | POA: Diagnosis not present

## 2024-02-23 DIAGNOSIS — F32A Depression, unspecified: Secondary | ICD-10-CM | POA: Diagnosis present

## 2024-02-23 DIAGNOSIS — Z6829 Body mass index (BMI) 29.0-29.9, adult: Secondary | ICD-10-CM | POA: Diagnosis not present

## 2024-02-23 DIAGNOSIS — R627 Adult failure to thrive: Secondary | ICD-10-CM | POA: Diagnosis not present

## 2024-02-23 LAB — BASIC METABOLIC PANEL WITH GFR
Anion gap: 10 (ref 5–15)
Anion gap: 7 (ref 5–15)
BUN: 21 mg/dL (ref 8–23)
BUN: 20 mg/dL (ref 8–23)
CO2: 24 mmol/L (ref 22–32)
CO2: 25 mmol/L (ref 22–32)
Calcium: 8.4 mg/dL — ABNORMAL LOW (ref 8.9–10.3)
Calcium: 8.4 mg/dL — ABNORMAL LOW (ref 8.9–10.3)
Chloride: 103 mmol/L (ref 98–111)
Chloride: 104 mmol/L (ref 98–111)
Creatinine, Ser: 0.52 mg/dL (ref 0.44–1.00)
Creatinine, Ser: 0.65 mg/dL (ref 0.44–1.00)
GFR, Estimated: >60 mL/min (ref >60)
GFR, Estimated: >60 mL/min (ref >60)
Glucose, Bld: 82 mg/dL (ref 70–99)
Glucose, Bld: 121 mg/dL — ABNORMAL HIGH (ref 70–99)
Potassium: 2.8 mmol/L — ABNORMAL LOW (ref 3.5–5.1)
Potassium: 3.3 mmol/L — ABNORMAL LOW (ref 3.5–5.1)
Sodium: 137 mmol/L (ref 135–145)
Sodium: 136 mmol/L (ref 135–145)

## 2024-02-23 LAB — URINALYSIS, ROUTINE W REFLEX MICROSCOPIC
Bacteria, UA: NONE SEEN
Bilirubin Urine: NEGATIVE
Glucose, UA: NEGATIVE mg/dL
Hgb urine dipstick: NEGATIVE
Ketones, ur: 5 mg/dL — AB
Nitrite: NEGATIVE
Protein, ur: 100 mg/dL — AB
Specific Gravity, Urine: 1.018 (ref 1.005–1.030)
pH: 7 (ref 5.0–8.0)

## 2024-02-23 LAB — RESP PANEL BY RT-PCR (RSV, FLU A&B, COVID)  RVPGX2
Influenza A by PCR: NEGATIVE
Influenza B by PCR: NEGATIVE
Resp Syncytial Virus by PCR: NEGATIVE
SARS Coronavirus 2 by RT PCR: NEGATIVE

## 2024-02-23 LAB — TSH: TSH: 2.228 u[IU]/mL (ref 0.350–4.500)

## 2024-02-23 LAB — MAGNESIUM: Magnesium: 2.1 mg/dL (ref 1.7–2.4)

## 2024-02-23 MED ORDER — LORAZEPAM 0.5 MG PO TABS: 0.5000 mg | ORAL_TABLET | Freq: Four times a day (QID) | ORAL | Status: DC | PRN

## 2024-02-23 MED ORDER — SODIUM CHLORIDE 0.9 % IV SOLN
1.0000 g | Freq: Once | INTRAVENOUS | Status: AC
  Administered 2024-02-23: 1 g via INTRAVENOUS
  Filled 2024-02-23: qty 20

## 2024-02-23 MED ORDER — ASPIRIN 325 MG PO TBEC
325.0000 mg | DELAYED_RELEASE_TABLET | Freq: Every day | ORAL | Status: DC
  Administered 2024-02-23 – 2024-02-25 (×3): 325 mg via ORAL
  Filled 2024-02-23 (×3): qty 1

## 2024-02-23 MED ORDER — ATORVASTATIN CALCIUM 20 MG PO TABS
20.0000 mg | ORAL_TABLET | ORAL | Status: DC
  Administered 2024-02-24: 20 mg via ORAL
  Filled 2024-02-23: qty 1

## 2024-02-23 MED ORDER — ALBUTEROL SULFATE (2.5 MG/3ML) 0.083% IN NEBU: 2.5000 mg | INHALATION_SOLUTION | RESPIRATORY_TRACT | Status: DC | PRN

## 2024-02-23 MED ORDER — ONDANSETRON HCL 4 MG PO TABS
4.0000 mg | ORAL_TABLET | Freq: Four times a day (QID) | ORAL | Status: DC | PRN
Start: 2024-02-23 — End: 2024-02-25

## 2024-02-23 MED ORDER — SODIUM CHLORIDE 0.9 % IV SOLN: 1.0000 g | Freq: Three times a day (TID) | INTRAVENOUS | Status: DC

## 2024-02-23 MED ORDER — ENOXAPARIN SODIUM 40 MG/0.4ML IJ SOSY
40.0000 mg | PREFILLED_SYRINGE | INTRAMUSCULAR | Status: DC
  Administered 2024-02-23 – 2024-02-25 (×3): 40 mg via SUBCUTANEOUS
  Filled 2024-02-23 (×3): qty 0.4

## 2024-02-23 MED ORDER — POTASSIUM CHLORIDE CRYS ER 20 MEQ PO TBCR
40.0000 meq | EXTENDED_RELEASE_TABLET | Freq: Once | ORAL | Status: AC
  Administered 2024-02-23: 40 meq via ORAL
  Filled 2024-02-23: qty 2

## 2024-02-23 MED ORDER — ONDANSETRON HCL 4 MG/2ML IJ SOLN: 4.0000 mg | Freq: Four times a day (QID) | INTRAMUSCULAR | Status: DC | PRN

## 2024-02-23 MED ORDER — ACETAMINOPHEN 650 MG RE SUPP: 650.0000 mg | Freq: Four times a day (QID) | RECTAL | Status: DC | PRN

## 2024-02-23 MED ORDER — TRAZODONE HCL 50 MG PO TABS: 25.0000 mg | ORAL_TABLET | Freq: Every evening | ORAL | Status: DC | PRN

## 2024-02-23 MED ORDER — ACETAMINOPHEN 325 MG PO TABS
650.0000 mg | ORAL_TABLET | Freq: Four times a day (QID) | ORAL | Status: DC | PRN
  Administered 2024-02-24: 650 mg via ORAL
  Filled 2024-02-23: qty 2

## 2024-02-23 MED ORDER — SODIUM CHLORIDE 0.9 % IV SOLN: 1.0000 g | Freq: Two times a day (BID) | INTRAVENOUS | Status: DC

## 2024-02-23 MED ORDER — FLUOXETINE HCL 20 MG PO CAPS
20.0000 mg | ORAL_CAPSULE | Freq: Every day | ORAL | Status: DC
  Administered 2024-02-23 – 2024-02-25 (×3): 20 mg via ORAL
  Filled 2024-02-23 (×3): qty 1

## 2024-02-23 MED ORDER — LEVOTHYROXINE SODIUM 88 MCG PO TABS
88.0000 ug | ORAL_TABLET | Freq: Every day | ORAL | Status: DC
  Administered 2024-02-24 – 2024-02-25 (×2): 88 ug via ORAL
  Filled 2024-02-23 (×2): qty 1

## 2024-02-23 NOTE — H&P (Signed)
 History and Physical  Brooke Owens WUJ:[id number] DOB: [birth year] DOA: 02/22/2024  PCP: Christene Lye, FNP   Chief Complaint: Altered mental status  HPI: Brooke Owens is a 78 y.o. female with medical history significant for advanced dementia who is a resident of memory care facility, recently treated as an outpatient for E. coli ESBL UTI now being admitted to the hospital with complaint of being less responsive than usual.  Reported fevers, falls, etc.  She was seen with similar complaints in the ER on 3/11, on that day blood culture obtained later grew ESBL E. coli UTI, Macrobid was called in to her facility.  Unable to confirm whether she truly took this medication or not.  Seems that presumably she was doing decently well up until yesterday, when she was brought to the ER due to complaints of reduced responsiveness.  At baseline the patient is nonverbal.  On my discussion with the patient, she is interactive, answered 1 or 2 questions, but as far as I can tell she is disoriented.  Review of Systems: Please see HPI for pertinent positives and negatives. A complete 10 system review of systems not be performed due to the patient's advanced dementia.  Past Medical History:  Diagnosis Date   Dementia (HCC)    Depression    Hypercholesteremia    Thyroid disease    Past Surgical History:  Procedure Laterality Date   ABDOMINAL HYSTERECTOMY     BREAST ENHANCEMENT SURGERY     HIP PINNING,CANNULATED Left 04/28/2023   Procedure: CANNULATED HIP PINNING;  Surgeon: Myrene Galas, MD;  Location: [facility] OR;  Service: Orthopedics;  Laterality: Left;   Social History:  reports that she has never smoked. She has never used smokeless tobacco. She reports that she does not currently use alcohol. She reports that she does not use drugs.  Allergies  Allergen Reactions   Demerol [Meperidine Hcl] Nausea And Vomiting    History reviewed. No pertinent family history.   Prior to Admission medications    Medication Sig Start Date End Date Taking? Authorizing Provider  acetaminophen (TYLENOL) 650 MG CR tablet Take 650 mg by mouth every 6 (six) hours as needed for pain or fever (headache).    [provider]  aspirin EC 325 MG tablet Take 325 mg by mouth daily.    [provider]  atorvastatin (LIPITOR) 20 MG tablet Take 1 tablet (20 mg total) by mouth every other day. 07/14/19   Philip Aspen, Limmie Patricia, MD  FLUoxetine (PROZAC) 20 MG capsule Take 20 mg by mouth daily.    [provider]  hydrochlorothiazide (HYDRODIURIL) 25 MG tablet Take 25 mg by mouth daily.    [provider]  levothyroxine (SYNTHROID) 88 MCG tablet Take 88 mcg by mouth daily before breakfast.    [provider]  LORazepam (ATIVAN) 0.5 MG tablet Take 0.5 mg by mouth every 6 (six) hours as needed for anxiety.    [provider]  Wound Cleansers (SIMPLY SALINE WOUND WASH SPRAY EX) Apply 1 application  topically See admin instructions. Apply to treat L calf. Cleanse with NS, apply Xeroform and wrap with Kerlix and Coban toes to knee twice a week.    [provider]    Physical Exam: BP (!) 115/50 (BP Location: Right Leg)   Pulse (!) 59   Temp 98.1 F (36.7 C)   Resp 15   Ht 5\' 4"  (1.626 m)   Wt 77.1 kg   SpO2 95%   BMI 29.18 kg/m  General: Frail elderly thin woman appearing in no acute distress lying in bed in her room.  She is alert and awake, appears to be disoriented.  Follows commands. Cardiovascular: RRR, no murmurs or rubs, no peripheral edema  Respiratory: clear to auscultation bilaterally, no wheezes, no crackles  Abdomen: soft, nontender, nondistended, normal bowel tones heard  Skin: dry, no rashes  Musculoskeletal: no joint effusions, normal range of motion  Psychiatric: appropriate affect, normal speech  Neurologic: extraocular muscles intact, clear speech, moving all extremities with intact sensorium         Labs on Admission:  Basic  Metabolic Panel: Recent Labs  Lab 02/22/24 2238  NA 139  K 2.5*  CL 102  CO2 31  GLUCOSE 88  BUN 30*  CREATININE 0.71  CALCIUM 8.7*  MG 2.1   Liver Function Tests: Recent Labs  Lab 02/22/24 2238  AST 37  ALT 20  ALKPHOS 65  BILITOT 1.3*  PROT 5.8*  ALBUMIN 3.1*   No results for input(s): "LIPASE", "AMYLASE" in the last 168 hours. No results for input(s): "AMMONIA" in the last 168 hours. CBC: Recent Labs  Lab 02/22/24 2238  WBC 6.6  HGB 11.2*  HCT 34.8*  MCV 89.9  PLT 252   Cardiac Enzymes: No results for input(s): "CKTOTAL", "CKMB", "CKMBINDEX", "TROPONINI" in the last 168 hours. BNP (last 3 results) No results for input(s): "BNP" in the last 8760 hours.  ProBNP (last 3 results) No results for input(s): "PROBNP" in the last 8760 hours.  CBG: Recent Labs  Lab 02/22/24 2255  GLUCAP 89    Radiological Exams on Admission: CT Head Wo Contrast Result Date: 02/23/2024 CLINICAL DATA:  Head trauma, minor (Age >= 65y); Neck trauma (Age >= 65y) EXAM: CT HEAD WITHOUT CONTRAST CT CERVICAL SPINE WITHOUT CONTRAST TECHNIQUE: Multidetector CT imaging of the head and cervical spine was performed following the standard protocol without intravenous contrast. Multiplanar CT image reconstructions of the cervical spine were also generated. RADIATION DOSE REDUCTION: This exam was performed according to the departmental dose-optimization program which includes automated exposure control, adjustment of the mA and/or kV according to patient size and/or use of iterative reconstruction technique. COMPARISON:  02/02/2024 FINDINGS: CT HEAD FINDINGS Brain: Normal anatomic configuration. Parenchymal volume loss is commensurate with the patient's age. Stable mild periventricular white matter changes are present likely reflecting the sequela of small vessel ischemia. No abnormal intra or extra-axial mass lesion or fluid collection. No abnormal mass effect or midline shift. No evidence of acute  intracranial hemorrhage or infarct. Ventricular size is normal. Cerebellum unremarkable. Vascular: No asymmetric hyperdense vasculature at the skull base. Skull: Intact Sinuses/Orbits: Paranasal sinuses are clear. Orbits are unremarkable. Other: Mastoid air cells and middle ear cavities are clear. Mild residual left frontal scalp soft tissue swelling. CT CERVICAL SPINE FINDINGS Alignment: Stable minimal retrolisthesis C2-3 and C3-4 and anterolisthesis C5-6 of approximately 1-2 mm. Skull base and vertebrae: Grade cervical alignment is normal. The atlantodental interval is not widened. No acute fracture of the cervical spine. Vertebral body height is preserved. Soft tissues and spinal canal: No prevertebral fluid or swelling. No visible canal hematoma. Disc levels: There is intervertebral disc space narrowing and endplate remodeling throughout the cervical spine in keeping with changes of advanced degenerative disc disease. Prevertebral soft tissues are thickened on sagittal reformats. Spinal is widely patent. Uncovertebral arthrosis results in severe bilateral neuroforaminal narrowing at C3-4 and on the left C4-5 C5-6. Moderate narrowing left at C6-7. Upper chest: Negative. Other: None IMPRESSION: 1.  No acute intracranial abnormality. No calvarial fracture. Mild residual left frontal scalp soft tissue swelling. 2. No acute fracture or listhesis of the cervical spine. 3. 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 Electronically Signed   By: Helyn Numbers M.D.   On: 02/23/2024 00:57   CT Cervical Spine Wo Contrast Result Date: 02/23/2024 CLINICAL DATA:  Head trauma, minor (Age >= 65y); Neck trauma (Age >= 65y) EXAM: CT HEAD WITHOUT CONTRAST CT CERVICAL SPINE WITHOUT CONTRAST TECHNIQUE: Multidetector CT imaging of the head and cervical spine was performed following the standard protocol without intravenous contrast. Multiplanar CT  image reconstructions of the cervical spine were also generated. RADIATION DOSE REDUCTION: This exam was performed according to the departmental dose-optimization program which includes automated exposure control, adjustment of the mA and/or kV according to patient size and/or use of iterative reconstruction technique. COMPARISON:  02/02/2024 FINDINGS: CT HEAD FINDINGS Brain: Normal anatomic configuration. Parenchymal volume loss is commensurate with the patient's age. Stable mild periventricular white matter changes are present likely reflecting the sequela of small vessel ischemia. No abnormal intra or extra-axial mass lesion or fluid collection. No abnormal mass effect or midline shift. No evidence of acute intracranial hemorrhage or infarct. Ventricular size is normal. Cerebellum unremarkable. Vascular: No asymmetric hyperdense vasculature at the skull base. Skull: Intact Sinuses/Orbits: Paranasal sinuses are clear. Orbits are unremarkable. Other: Mastoid air cells and middle ear cavities are clear. Mild residual left frontal scalp soft tissue swelling. CT CERVICAL SPINE FINDINGS Alignment: Stable minimal retrolisthesis C2-3 and C3-4 and anterolisthesis C5-6 of approximately 1-2 mm. Skull base and vertebrae: Grade cervical alignment is normal. The atlantodental interval is not widened. No acute fracture of the cervical spine. Vertebral body height is preserved. Soft tissues and spinal canal: No prevertebral fluid or swelling. No visible canal hematoma. Disc levels: There is intervertebral disc space narrowing and endplate remodeling throughout the cervical spine in keeping with changes of advanced degenerative disc disease. Prevertebral soft tissues are thickened on sagittal reformats. Spinal is widely patent. Uncovertebral arthrosis results in severe bilateral neuroforaminal narrowing at C3-4 and on the left C4-5 C5-6. Moderate narrowing left at C6-7. Upper chest: Negative. Other: None IMPRESSION: 1. No acute  intracranial abnormality. No calvarial fracture. Mild residual left frontal scalp soft tissue swelling. 2. No acute fracture or listhesis of the cervical spine. 3. 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 Electronically Signed   By: Helyn Numbers M.D.   On: 02/23/2024 00:57   DG Foot Complete Right Result Date: 02/22/2024 CLINICAL DATA:  Injury. Demineralization. Dementia, altered mental status, nonverbal patient. EXAM: RIGHT FOOT COMPLETE - 3+ VIEW COMPARISON:  None Available. FINDINGS: No acute fracture or dislocation. The phalanges are not well evaluated due to plantar flexion. No radiographic evidence of osteomyelitis. Degenerative changes about the midfoot and IP joints. Calcaneal spur. Soft tissues are radiographically unremarkable. IMPRESSION: Negative. Electronically Signed   By: Minerva Fester M.D.   On: 02/22/2024 23:25   DG Chest Portable 1 View Result Date: 02/22/2024 CLINICAL DATA:  Altered level of consciousness, dementia EXAM: PORTABLE CHEST 1 VIEW COMPARISON:  02/02/2024 FINDINGS: Single frontal view of the chest demonstrates a stable cardiac silhouette. No acute airspace disease, effusion, or pneumothorax. Stable right upper lobe calcified granuloma. No acute bony abnormalities. Stable calcified left breast implant. IMPRESSION: 1. Stable exam, no acute process. Electronically Signed   By: Sharlet Salina M.D.   On: 02/22/2024  23:22   Assessment/Plan Brooke Owens is a 78 y.o. female with medical history significant for advanced dementia who is a resident of memory care facility, recently treated as an outpatient for E. coli ESBL UTI now being admitted to the hospital with complaint of being less responsive than usual.    Weakness/reduced responsiveness-unclear etiology, I see no evidence of acute infection, she has no fever, leukocytosis, and her urinalysis is not impressive.  It is also a very dirty  sample.  She was presumably treated appropriately for her prior UTI the culture for which was obtained on 3/11.  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 -Observation admission -Discontinue further antibiotics, but follow-up urine culture -Replete potassium -Update TSH  Hypokalemia-she received only 10 mEq IV potassium overnight.  Magnesium level is normal. -Will give 40 mill equivalents p.o. potassium x 1 now, and check potassium level -Presumably she will need more potassium supplementation  Advanced dementia-patient is nonverbal and demented at baseline  Hypothyroidism-continue Synthroid, check TSH as above  Hyperlipidemia-Lipitor  DVT prophylaxis: Lovenox     Code Status: Do not attempt resuscitation (DNR) PRE-ARREST INTERVENTIONS DESIRED  Consults called: None  Admission status: Observation  Time spent: 48 minutes  Rheanna Sergent Sharlette Dense MD [facility] Pager [phone]  If 7PM-7AM, please contact night-coverage [email or web address] Password [email or web address]  02/23/2024, 7:45 AM

## 2024-02-23 NOTE — Progress Notes (Addendum)
 Pharmacy Antibiotic Note  Brooke Owens is a 78 y.o. female admitted on 02/22/2024 with UTI.  Pharmacy has been consulted for Meropenem dosing. -Urine cx from 2 weeks ago +Ecoli ESBL -Scr <0.8, patient's baseline  Plan: Meropenem 1gm IV q8h Monitor renal function and cx data    Height: 5\' 4"  (162.6 cm) Weight: 77.1 kg (169 lb 15.6 oz) IBW/kg (Calculated) : 54.7  Temp (24hrs), Avg:98.1 F (36.7 C), Min:97.9 F (36.6 C), Max:98.3 F (36.8 C)  Recent Labs  Lab 02/22/24 2238  WBC 6.6  CREATININE 0.71    Estimated Creatinine Clearance: 58.3 mL/min (by C-G formula based on SCr of 0.71 mg/dL).    Allergies  Allergen Reactions   Demerol [Meperidine Hcl] Nausea And Vomiting    Antimicrobials this admission: 4/1 Meropenem >>   Dose adjustments this admission:  Microbiology results: 3/31 Resp PCR: negative From previous encounter: 3/11 UCx: Ecoli ESBL (S=imipenem, nitrofurantoin, pip/tazo)    Thank you for allowing pharmacy to be a part of this patient's care.  Junita Push PharmD 02/23/2024 3:05 AM

## 2024-02-23 NOTE — ED Notes (Signed)
 ED TO INPATIENT HANDOFF REPORT  ED Nurse Name and Phone #: Gerda Diss, RN  S Name/Age/Gender Brooke Owens 78 y.o. female Room/Bed: WA23/WA23  Code Status   Code Status: Prior  Home/SNF/Other Nursing Home Patient oriented to: self Is this baseline? Yes   Triage Complete: Triage complete  Chief Complaint Acute encephalopathy [G93.40]  Triage Note Pt arrived from a facility for AMS. Pt has hx of dementia and is nonverbal.  BP106/40; P2 95% HR 58 cbg 115   Allergies Allergies  Allergen Reactions   Demerol [Meperidine Hcl] Nausea And Vomiting    Level of Care/Admitting Diagnosis ED Disposition     ED Disposition  Admit   Condition  --   Comment  Hospital Area: [facility] [[id number]]  Level of Care: Telemetry [5]  Admit to tele based on following criteria: Monitor for Ischemic changes  May place patient in observation at [facility] or [facility] if equivalent level of care is available:: Yes  Covid Evaluation: Asymptomatic - no recent exposure (last 10 days) testing not required  Diagnosis: Acute encephalopathy [409811]  Admitting Physician: Eduard Clos [phone]  Attending Physician: Eduard Clos [phone]          B Medical/Surgery History Past Medical History:  Diagnosis Date   Dementia (HCC)    Depression    Hypercholesteremia    Thyroid disease    Past Surgical History:  Procedure Laterality Date   ABDOMINAL HYSTERECTOMY     BREAST ENHANCEMENT SURGERY     HIP PINNING,CANNULATED Left 04/28/2023   Procedure: CANNULATED HIP PINNING;  Surgeon: Myrene Galas, MD;  Location: [facility] OR;  Service: Orthopedics;  Laterality: Left;     A IV Location/Drains/Wounds Patient Lines/Drains/Airways Status     Active Line/Drains/Airways     Name Placement date Placement time Site Days   Peripheral IV 02/22/24 20 G Right;Posterior Hand 02/22/24  2237  Hand  1   Peripheral IV 02/22/24 Distal;Left;Posterior Forearm 02/22/24  2238   Forearm  1            Intake/Output Last 24 hours  Intake/Output Summary (Last 24 hours) at 02/23/2024 0518 Last data filed at 02/23/2024 0518 Gross per 24 hour  Intake 1200 ml  Output --  Net 1200 ml    Labs/Imaging Results for orders placed or performed during the hospital encounter of 02/22/24 (from the past 48 hours)  Urinalysis, Routine w reflex microscopic -Urine, Clean Catch     Status: Abnormal   Collection Time: 02/22/24  1:45 AM  Result Value Ref Range   Color, Urine YELLOW YELLOW   APPearance CLOUDY (A) CLEAR   Specific Gravity, Urine 1.018 1.005 - 1.030   pH 7.0 5.0 - 8.0   Glucose, UA NEGATIVE NEGATIVE mg/dL   Hgb urine dipstick NEGATIVE NEGATIVE   Bilirubin Urine NEGATIVE NEGATIVE   Ketones, ur 5 (A) NEGATIVE mg/dL   Protein, ur 621 (A) NEGATIVE mg/dL   Nitrite NEGATIVE NEGATIVE   Leukocytes,Ua SMALL (A) NEGATIVE   RBC / HPF 6-10 0 - 5 RBC/hpf   WBC, UA 21-50 0 - 5 WBC/hpf   Bacteria, UA NONE SEEN NONE SEEN   Squamous Epithelial / HPF 21-50 0 - 5 /HPF   Mucus PRESENT     Comment: Performed at [facility], [address] [place], [place] [address]  Comprehensive metabolic panel     Status: Abnormal   Collection Time: 02/22/24 10:38 PM  Result Value Ref Range   Sodium 139  135 - 145 mmol/L   Potassium 2.5 (LL) 3.5 - 5.1 mmol/L    Comment: CRITICAL RESULT CALLED TO, READ BACK BY AND VERIFIED WITH  ABOGYE RN AT 2307 ON 3.31.25. FA    Chloride 102 98 - 111 mmol/L   CO2 31 22 - 32 mmol/L   Glucose, Bld 88 70 - 99 mg/dL    Comment: Glucose reference range applies only to samples taken after fasting for at least 8 hours.   BUN 30 (H) 8 - 23 mg/dL   Creatinine, Ser 8.29 0.44 - 1.00 mg/dL   Calcium 8.7 (L) 8.9 - 10.3 mg/dL   Total Protein 5.8 (L) 6.5 - 8.1 g/dL   Albumin 3.1 (L) 3.5 - 5.0 g/dL   AST 37 15 - 41 U/L   ALT 20 0 - 44 U/L   Alkaline Phosphatase 65 38 - 126 U/L   Total Bilirubin 1.3 (H) 0.0 - 1.2 mg/dL   GFR, Estimated >56  >21 mL/min    Comment: (NOTE) Calculated using the CKD-EPI Creatinine Equation (2021)    Anion gap 6 5 - 15    Comment: Performed at [facility], [address], [place], [place] [address]  CBC     Status: Abnormal   Collection Time: 02/22/24 10:38 PM  Result Value Ref Range   WBC 6.6 4.0 - 10.5 K/uL   RBC 3.87 3.87 - 5.11 MIL/uL   Hemoglobin 11.2 (L) 12.0 - 15.0 g/dL   HCT 78.4 (L) 69.6 - 29.5 %   MCV 89.9 80.0 - 100.0 fL   MCH 28.9 26.0 - 34.0 pg   MCHC 32.2 30.0 - 36.0 g/dL   RDW 28.4 13.2 - 44.0 %   Platelets 252 150 - 400 K/uL   nRBC 0.0 0.0 - 0.2 %    Comment: Performed at [facility], [address], [place], [place] [address]  Magnesium     Status: None   Collection Time: 02/22/24 10:38 PM  Result Value Ref Range   Magnesium 2.1 1.7 - 2.4 mg/dL    Comment: Performed at [facility], [address] [place], [place] [address]  CBG monitoring, ED     Status: None   Collection Time: 02/22/24 10:55 PM  Result Value Ref Range   Glucose-Capillary 89 70 - 99 mg/dL    Comment: Glucose reference range applies only to samples taken after fasting for at least 8 hours.  Resp panel by RT-PCR (RSV, Flu A&B, Covid) Anterior Nasal Swab     Status: None   Collection Time: 02/23/24 12:36 AM   Specimen: Anterior Nasal Swab  Result Value Ref Range   SARS Coronavirus 2 by RT PCR NEGATIVE NEGATIVE    Comment: (NOTE) SARS-CoV-2 target nucleic acids are NOT DETECTED.  The SARS-CoV-2 RNA is generally detectable in upper respiratory specimens during the acute phase of infection. The lowest concentration of SARS-CoV-2 viral copies this assay can detect is 138 copies/mL. A negative result does not preclude SARS-Cov-2 infection and should not be used as the sole basis for treatment or other patient management decisions. A negative result may occur with  improper specimen collection/handling, submission of specimen other than  nasopharyngeal swab, presence of viral mutation(s) within the areas targeted by this assay, and inadequate number of viral copies(<138 copies/mL). A negative result must be combined with clinical observations, patient history, and epidemiological information. The expected result is Negative.  Fact Sheet for Patients:  BloggerCourse.com  Fact Sheet for Healthcare  Providers:  SeriousBroker.it  This test is no t yet approved or cleared by the [place] and  has been authorized for detection and/or diagnosis of SARS-CoV-2 by FDA under an Emergency Use Authorization (EUA). This EUA will remain  in effect (meaning this test can be used) for the duration of the COVID-19 declaration under Section 564(b)(1) of the Act, 21 U.S.C.section 360bbb-3(b)(1), unless the authorization is terminated  or revoked sooner.       Influenza A by PCR NEGATIVE NEGATIVE   Influenza B by PCR NEGATIVE NEGATIVE    Comment: (NOTE) The Xpert Xpress SARS-CoV-2/FLU/RSV plus assay is intended as an aid in the diagnosis of influenza from Nasopharyngeal swab specimens and should not be used as a sole basis for treatment. Nasal washings and aspirates are unacceptable for Xpert Xpress SARS-CoV-2/FLU/RSV testing.  Fact Sheet for Patients: BloggerCourse.com  Fact Sheet for Healthcare Providers: SeriousBroker.it  This test is not yet approved or cleared by the [place] FDA and has been authorized for detection and/or diagnosis of SARS-CoV-2 by FDA under an Emergency Use Authorization (EUA). This EUA will remain in effect (meaning this test can be used) for the duration of the COVID-19 declaration under Section 564(b)(1) of the Act, 21 U.S.C. section 360bbb-3(b)(1), unless the authorization is terminated or revoked.     Resp Syncytial Virus by PCR NEGATIVE NEGATIVE    Comment: (NOTE) Fact Sheet for  Patients: BloggerCourse.com  Fact Sheet for Healthcare Providers: SeriousBroker.it  This test is not yet approved or cleared by the [place] FDA and has been authorized for detection and/or diagnosis of SARS-CoV-2 by FDA under an Emergency Use Authorization (EUA). This EUA will remain in effect (meaning this test can be used) for the duration of the COVID-19 declaration under Section 564(b)(1) of the Act, 21 U.S.C. section 360bbb-3(b)(1), unless the authorization is terminated or revoked.  Performed at [facility], [address] [place], [place] [address]    CT Head Wo Contrast Result Date: 02/23/2024 CLINICAL DATA:  Head trauma, minor (Age >= 65y); Neck trauma (Age >= 65y) EXAM: CT HEAD WITHOUT CONTRAST CT CERVICAL SPINE WITHOUT CONTRAST TECHNIQUE: Multidetector CT imaging of the head and cervical spine was performed following the standard protocol without intravenous contrast. Multiplanar CT image reconstructions of the cervical spine were also generated. RADIATION DOSE REDUCTION: This exam was performed according to the departmental dose-optimization program which includes automated exposure control, adjustment of the mA and/or kV according to patient size and/or use of iterative reconstruction technique. COMPARISON:  02/02/2024 FINDINGS: CT HEAD FINDINGS Brain: Normal anatomic configuration. Parenchymal volume loss is commensurate with the patient's age. Stable mild periventricular white matter changes are present likely reflecting the sequela of small vessel ischemia. No abnormal intra or extra-axial mass lesion or fluid collection. No abnormal mass effect or midline shift. No evidence of acute intracranial hemorrhage or infarct. Ventricular size is normal. Cerebellum unremarkable. Vascular: No asymmetric hyperdense vasculature at the skull base. Skull: Intact Sinuses/Orbits: Paranasal sinuses are clear. Orbits are  unremarkable. Other: Mastoid air cells and middle ear cavities are clear. Mild residual left frontal scalp soft tissue swelling. CT CERVICAL SPINE FINDINGS Alignment: Stable minimal retrolisthesis C2-3 and C3-4 and anterolisthesis C5-6 of approximately 1-2 mm. Skull base and vertebrae: Grade cervical alignment is normal. The atlantodental interval is not widened. No acute fracture of the cervical spine. Vertebral body height is preserved. Soft tissues and spinal canal: No prevertebral fluid or swelling. No visible canal hematoma. Disc levels: There is  intervertebral disc space narrowing and endplate remodeling throughout the cervical spine in keeping with changes of advanced degenerative disc disease. Prevertebral soft tissues are thickened on sagittal reformats. Spinal is widely patent. Uncovertebral arthrosis results in severe bilateral neuroforaminal narrowing at C3-4 and on the left C4-5 C5-6. Moderate narrowing left at C6-7. Upper chest: Negative. Other: None IMPRESSION: 1. No acute intracranial abnormality. No calvarial fracture. Mild residual left frontal scalp soft tissue swelling. 2. No acute fracture or listhesis of the cervical spine. 3. 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 Electronically Signed   By: Helyn Numbers M.D.   On: 02/23/2024 00:57   CT Cervical Spine Wo Contrast Result Date: 02/23/2024 CLINICAL DATA:  Head trauma, minor (Age >= 65y); Neck trauma (Age >= 65y) EXAM: CT HEAD WITHOUT CONTRAST CT CERVICAL SPINE WITHOUT CONTRAST TECHNIQUE: Multidetector CT imaging of the head and cervical spine was performed following the standard protocol without intravenous contrast. Multiplanar CT image reconstructions of the cervical spine were also generated. RADIATION DOSE REDUCTION: This exam was performed according to the departmental dose-optimization program which includes automated exposure control, adjustment of  the mA and/or kV according to patient size and/or use of iterative reconstruction technique. COMPARISON:  02/02/2024 FINDINGS: CT HEAD FINDINGS Brain: Normal anatomic configuration. Parenchymal volume loss is commensurate with the patient's age. Stable mild periventricular white matter changes are present likely reflecting the sequela of small vessel ischemia. No abnormal intra or extra-axial mass lesion or fluid collection. No abnormal mass effect or midline shift. No evidence of acute intracranial hemorrhage or infarct. Ventricular size is normal. Cerebellum unremarkable. Vascular: No asymmetric hyperdense vasculature at the skull base. Skull: Intact Sinuses/Orbits: Paranasal sinuses are clear. Orbits are unremarkable. Other: Mastoid air cells and middle ear cavities are clear. Mild residual left frontal scalp soft tissue swelling. CT CERVICAL SPINE FINDINGS Alignment: Stable minimal retrolisthesis C2-3 and C3-4 and anterolisthesis C5-6 of approximately 1-2 mm. Skull base and vertebrae: Grade cervical alignment is normal. The atlantodental interval is not widened. No acute fracture of the cervical spine. Vertebral body height is preserved. Soft tissues and spinal canal: No prevertebral fluid or swelling. No visible canal hematoma. Disc levels: There is intervertebral disc space narrowing and endplate remodeling throughout the cervical spine in keeping with changes of advanced degenerative disc disease. Prevertebral soft tissues are thickened on sagittal reformats. Spinal is widely patent. Uncovertebral arthrosis results in severe bilateral neuroforaminal narrowing at C3-4 and on the left C4-5 C5-6. Moderate narrowing left at C6-7. Upper chest: Negative. Other: None IMPRESSION: 1. No acute intracranial abnormality. No calvarial fracture. Mild residual left frontal scalp soft tissue swelling. 2. No acute fracture or listhesis of the cervical spine. 3. Advanced degenerative disc and degenerative joint disease  resulting in multilevel neuroforaminal narrowing as described above. Electronically Signed   By: Helyn Numbers M.D.   On: 02/23/2024 00:57   DG Foot Complete Right Result Date: 02/22/2024 CLINICAL DATA:  Injury. Demineralization. Dementia, altered mental status, nonverbal patient. EXAM: RIGHT FOOT COMPLETE - 3+ VIEW COMPARISON:  None Available. FINDINGS: No acute fracture or dislocation. The phalanges are not well evaluated due to plantar flexion. No radiographic evidence of osteomyelitis. Degenerative changes about the midfoot and IP joints. Calcaneal spur. Soft tissues are radiographically unremarkable. IMPRESSION: Negative. Electronically Signed   By: Minerva Fester M.D.   On: 02/22/2024 23:25   DG Chest Portable 1 View Result Date: 02/22/2024 CLINICAL DATA:  Altered level of consciousness, dementia EXAM: PORTABLE  CHEST 1 VIEW COMPARISON:  02/02/2024 FINDINGS: Single frontal view of the chest demonstrates a stable cardiac silhouette. No acute airspace disease, effusion, or pneumothorax. Stable right upper lobe calcified granuloma. No acute bony abnormalities. Stable calcified left breast implant. IMPRESSION: 1. Stable exam, no acute process. Electronically Signed   By: Sharlet Salina M.D.   On: 02/22/2024 23:22    Pending Labs Unresulted Labs (From admission, onward)     Start     Ordered   02/23/24 0251  Urine Culture  Once,   URGENT       Question:  Indication  Answer:  Altered mental status (if no other cause identified)   02/23/24 0250            Vitals/Pain Today's Vitals   02/22/24 2221 02/22/24 2236 02/23/24 0115 02/23/24 0219  BP: (!) 101/50  (!) 110/44   Pulse: 62  60   Resp: 16  17   Temp: 98.3 F (36.8 C)   97.9 F (36.6 C)  TempSrc:    Axillary  SpO2: 96%  91%   Weight:  77.1 kg    Height:  5\' 4"  (1.626 m)      Isolation Precautions No active isolations  Medications Medications  meropenem (MERREM) 1 g in sodium chloride 0.9 % 100 mL IVPB (has no  administration in time range)  sodium chloride 0.9 % bolus 1,000 mL (0 mLs Intravenous Stopped 02/23/24 0119)  potassium chloride 10 mEq in 100 mL IVPB (0 mEq Intravenous Stopped 02/23/24 0338)  meropenem (MERREM) 1 g in sodium chloride 0.9 % 100 mL IVPB (0 g Intravenous Stopped 02/23/24 0518)    Mobility non-ambulatory     Focused Assessments Neuro; vital   R Recommendations: See Admitting Provider Note  Report given to:   Additional Notes: from [facility]

## 2024-02-23 NOTE — Plan of Care (Signed)
   Problem: Nutrition: Goal: Adequate nutrition will be maintained Outcome: Not Progressing

## 2024-02-23 NOTE — ED Provider Notes (Signed)
  [place] EMERGENCY DEPARTMENT AT [facility] Provider Note    Patient signed out to me at shift change.  Hx of dementia.  More somnolent than normal.  K is 2.5.  Getting IV potassium.  Continuous cardiac monitoring. Recent ESBL.  Will start meropenem.   Will need admission to hospital.  Procedures .Critical Care  Performed by: Roxy Horseman, PA-C Authorized by: Roxy Horseman, PA-C   Critical care provider statement:    Critical care time (minutes):  35   Critical care was necessary to treat or prevent imminent or life-threatening deterioration of the following conditions:  Metabolic crisis   Critical care was time spent personally by me on the following activities:  Development of treatment plan with patient or surrogate, discussions with consultants, evaluation of patient's response to treatment, examination of patient, ordering and review of laboratory studies, ordering and review of radiographic studies, ordering and performing treatments and interventions, pulse oximetry, re-evaluation of patient's condition and review of old charts      Roxy Horseman, PA-C 02/23/24 0305    Nira Conn, MD 02/23/24 [phone]

## 2024-02-24 DIAGNOSIS — R627 Adult failure to thrive: Secondary | ICD-10-CM

## 2024-02-24 DIAGNOSIS — Z7189 Other specified counseling: Secondary | ICD-10-CM

## 2024-02-24 DIAGNOSIS — F03C0 Unspecified dementia, severe, without behavioral disturbance, psychotic disturbance, mood disturbance, and anxiety: Secondary | ICD-10-CM

## 2024-02-24 DIAGNOSIS — G9340 Encephalopathy, unspecified: Secondary | ICD-10-CM | POA: Diagnosis not present

## 2024-02-24 LAB — MAGNESIUM: Magnesium: 1.7 mg/dL (ref 1.7–2.4)

## 2024-02-24 LAB — CBC
HCT: 37.5 % (ref 36.0–46.0)
Hemoglobin: 11.9 g/dL — ABNORMAL LOW (ref 12.0–15.0)
MCH: 28.8 pg (ref 26.0–34.0)
MCHC: 31.7 g/dL (ref 30.0–36.0)
MCV: 90.8 fL (ref 80.0–100.0)
Platelets: 260 10*3/uL (ref 150–400)
RBC: 4.13 MIL/uL (ref 3.87–5.11)
RDW: 13.3 % (ref 11.5–15.5)
WBC: 10.8 10*3/uL — ABNORMAL HIGH (ref 4.0–10.5)
nRBC: 0 % (ref 0.0–0.2)

## 2024-02-24 LAB — BASIC METABOLIC PANEL WITH GFR
Anion gap: 9 (ref 5–15)
BUN: 14 mg/dL (ref 8–23)
CO2: 25 mmol/L (ref 22–32)
Calcium: 8.7 mg/dL — ABNORMAL LOW (ref 8.9–10.3)
Chloride: 103 mmol/L (ref 98–111)
Creatinine, Ser: 0.44 mg/dL (ref 0.44–1.00)
GFR, Estimated: >60 mL/min (ref >60)
Glucose, Bld: 79 mg/dL (ref 70–99)
Potassium: 3.2 mmol/L — ABNORMAL LOW (ref 3.5–5.1)
Sodium: 137 mmol/L (ref 135–145)

## 2024-02-24 LAB — URINE CULTURE

## 2024-02-24 LAB — GLUCOSE, CAPILLARY
Glucose-Capillary: 89 mg/dL (ref 70–99)
Glucose-Capillary: 82 mg/dL (ref 70–99)

## 2024-02-24 MED ORDER — MAGNESIUM SULFATE 2 GM/50ML IV SOLN
2.0000 g | Freq: Once | INTRAVENOUS | Status: AC
  Administered 2024-02-24: 2 g via INTRAVENOUS
  Filled 2024-02-24: qty 50

## 2024-02-24 MED ORDER — POTASSIUM CHLORIDE CRYS ER 20 MEQ PO TBCR
40.0000 meq | EXTENDED_RELEASE_TABLET | Freq: Once | ORAL | Status: AC
  Administered 2024-02-24: 40 meq via ORAL
  Filled 2024-02-24: qty 2

## 2024-02-24 NOTE — Progress Notes (Signed)
 PROGRESS NOTE    Brooke Owens  EAV:[id number] DOB: 07-09-1946 DOA: 02/22/2024 PCP: Christene Lye, FNP    Brief Narrative:  Brooke Owens is a 78 y.o. female with medical history significant for advanced dementia who is a resident of memory care facility, recently treated as an outpatient for E. coli ESBL UTI now being admitted to the hospital with complaint of being less responsive than usual.    Assessment and Plan: Weakness/reduced responsiveness-unclear etiology, I see no evidence of acute infection, she has no fever,  and her urinalysis is not impressive.  Urine was a very dirty sample.  She was presumably treated appropriately for her prior UTI the culture for which was obtained on 3/11 as culture with multiple species.  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 -Discontinue further antibiotics -Replete potassium --TOC for back to facility ([facility])   Hypokalemia- Continue to replete-- due to hydrochlorothiazide-- would not place on again   Advanced dementia -patient is minimally verbal and demented at baseline-- since hip fracture   Hypothyroidism -continue Synthroid -TSH normal   Hyperlipidemia -Lipitor     Appears family requested palliative care consultation which is appropriate with her advanced dementia and decline since hip fracture.   I recommended to family for her to return to her memory care and transition to palliative care/hospice and not to manage what they can there but not to return to hospital.  NP at memory care: Christene Lye- [phone]   DVT prophylaxis: enoxaparin (LOVENOX) injection 40 mg Start: 02/23/24 1000    Code Status: Do not attempt resuscitation (DNR) PRE-ARREST INTERVENTIONS DESIRED Family Communication: called daughter  Disposition Plan:  Level of care: Telemetry Status is: Inpatient     Consultants:  Palliative care   Subjective: Able to say name  Objective: Vitals:    02/23/24 0605 02/23/24 1011 02/23/24 2155 02/24/24 0505  BP: (!) 115/50 (!) 103/47 132/75 112/63  Pulse: (!) 59 66 70 74  Resp: 15 18 16 16   Temp: 98.1 F (36.7 C) 98.2 F (36.8 C) 97.8 F (36.6 C) 98.1 F (36.7 C)  TempSrc:  Oral Oral Oral  SpO2: 95% 95% 98% 99%  Weight:      Height:        Intake/Output Summary (Last 24 hours) at 02/24/2024 0844 Last data filed at 02/24/2024 0600 Gross per 24 hour  Intake 900 ml  Output 700 ml  Net 200 ml   Filed Weights   02/22/24 2236  Weight: 77.1 kg    Examination:   General: Appearance:     Overweight female in no acute distress     Lungs:     respirations unlabored  Heart:    Normal heart rate.    MS:   All extremities are intact.    Neurologic:   Awake, alert       Data Reviewed: I have personally reviewed following labs and imaging studies  CBC: Recent Labs  Lab 02/22/24 2238 02/24/24 0345  WBC 6.6 10.8*  HGB 11.2* 11.9*  HCT 34.8* 37.5  MCV 89.9 90.8  PLT 252 260   Basic Metabolic Panel: Recent Labs  Lab 02/22/24 2238 02/23/24 0748 02/23/24 1447 02/24/24 0345  NA 139 137 136 137  K 2.5* 2.8* 3.3* 3.2*  CL 102 103 104 103  CO2 31 24 25 25   GLUCOSE 88 82 121* 79  BUN 30* 21 20 14   CREATININE 0.71 0.52 0.65 0.44  CALCIUM 8.7* 8.4* 8.4*  8.7*  MG 2.1  --   --   --    GFR: Estimated Creatinine Clearance: 58.3 mL/min (by C-G formula based on SCr of 0.44 mg/dL). Liver Function Tests: Recent Labs  Lab 02/22/24 2238  AST 37  ALT 20  ALKPHOS 65  BILITOT 1.3*  PROT 5.8*  ALBUMIN 3.1*   No results for input(s): "LIPASE", "AMYLASE" in the last 168 hours. No results for input(s): "AMMONIA" in the last 168 hours. Coagulation Profile: No results for input(s): "INR", "PROTIME" in the last 168 hours. Cardiac Enzymes: No results for input(s): "CKTOTAL", "CKMB", "CKMBINDEX", "TROPONINI" in the last 168 hours. BNP (last 3 results) No results for input(s): "PROBNP" in the last 8760 hours. HbA1C: No  results for input(s): "HGBA1C" in the last 72 hours. CBG: Recent Labs  Lab 02/22/24 2255 02/24/24 0831  GLUCAP 89 89   Lipid Profile: No results for input(s): "CHOL", "HDL", "LDLCALC", "TRIG", "CHOLHDL", "LDLDIRECT" in the last 72 hours. Thyroid Function Tests: Recent Labs    02/23/24 0927  TSH 2.228   Anemia Panel: No results for input(s): "VITAMINB12", "FOLATE", "FERRITIN", "TIBC", "IRON", "RETICCTPCT" in the last 72 hours. Sepsis Labs: No results for input(s): "PROCALCITON", "LATICACIDVEN" in the last 168 hours.  Recent Results (from the past 240 hours)  Resp panel by RT-PCR (RSV, Flu A&B, Covid) Anterior Nasal Swab     Status: None   Collection Time: 02/23/24 12:36 AM   Specimen: Anterior Nasal Swab  Result Value Ref Range Status   SARS Coronavirus 2 by RT PCR NEGATIVE NEGATIVE Final    Comment: (NOTE) SARS-CoV-2 target nucleic acids are NOT DETECTED.  The SARS-CoV-2 RNA is generally detectable in upper respiratory specimens during the acute phase of infection. The lowest concentration of SARS-CoV-2 viral copies this assay can detect is 138 copies/mL. A negative result does not preclude SARS-Cov-2 infection and should not be used as the sole basis for treatment or other patient management decisions. A negative result may occur with  improper specimen collection/handling, submission of specimen other than nasopharyngeal swab, presence of viral mutation(s) within the areas targeted by this assay, and inadequate number of viral copies(<138 copies/mL). A negative result must be combined with clinical observations, patient history, and epidemiological information. The expected result is Negative.  Fact Sheet for Patients:  BloggerCourse.com  Fact Sheet for Healthcare Providers:  SeriousBroker.it  This test is no t yet approved or cleared by the [place] FDA and  has been authorized for detection and/or diagnosis of  SARS-CoV-2 by FDA under an Emergency Use Authorization (EUA). This EUA will remain  in effect (meaning this test can be used) for the duration of the COVID-19 declaration under Section 564(b)(1) of the Act, 21 U.S.C.section 360bbb-3(b)(1), unless the authorization is terminated  or revoked sooner.       Influenza A by PCR NEGATIVE NEGATIVE Final   Influenza B by PCR NEGATIVE NEGATIVE Final    Comment: (NOTE) The Xpert Xpress SARS-CoV-2/FLU/RSV plus assay is intended as an aid in the diagnosis of influenza from Nasopharyngeal swab specimens and should not be used as a sole basis for treatment. Nasal washings and aspirates are unacceptable for Xpert Xpress SARS-CoV-2/FLU/RSV testing.  Fact Sheet for Patients: BloggerCourse.com  Fact Sheet for Healthcare Providers: SeriousBroker.it  This test is not yet approved or cleared by the [place] FDA and has been authorized for detection and/or diagnosis of SARS-CoV-2 by FDA under an Emergency Use Authorization (EUA). This EUA will remain in effect (meaning this test can  be used) for the duration of the COVID-19 declaration under Section 564(b)(1) of the Act, 21 U.S.C. section 360bbb-3(b)(1), unless the authorization is terminated or revoked.     Resp Syncytial Virus by PCR NEGATIVE NEGATIVE Final    Comment: (NOTE) Fact Sheet for Patients: BloggerCourse.com  Fact Sheet for Healthcare Providers: SeriousBroker.it  This test is not yet approved or cleared by the [place] FDA and has been authorized for detection and/or diagnosis of SARS-CoV-2 by FDA under an Emergency Use Authorization (EUA). This EUA will remain in effect (meaning this test can be used) for the duration of the COVID-19 declaration under Section 564(b)(1) of the Act, 21 U.S.C. section 360bbb-3(b)(1), unless the authorization is terminated  or revoked.  Performed at [facility], [address] [place], [place] [address]   Urine Culture     Status: Abnormal   Collection Time: 02/23/24  1:45 AM   Specimen: Urine, Catheterized  Result Value Ref Range Status   Specimen Description   Final    URINE, CATHETERIZED Performed at [facility], [address] [place], [place] [address]    Special Requests   Final    NONE Performed at [facility], [address], [place], [place] [address]    Culture MULTIPLE SPECIES PRESENT, SUGGEST RECOLLECTION (A)  Final   Report Status 02/24/2024 FINAL  Final         Radiology Studies: CT Head Wo Contrast Result Date: 02/23/2024 CLINICAL DATA:  Head trauma, minor (Age >= 65y); Neck trauma (Age >= 65y) EXAM: CT HEAD WITHOUT CONTRAST CT CERVICAL SPINE WITHOUT CONTRAST TECHNIQUE: Multidetector CT imaging of the head and cervical spine was performed following the standard protocol without intravenous contrast. Multiplanar CT image reconstructions of the cervical spine were also generated. RADIATION DOSE REDUCTION: This exam was performed according to the departmental dose-optimization program which includes automated exposure control, adjustment of the mA and/or kV according to patient size and/or use of iterative reconstruction technique. COMPARISON:  02/02/2024 FINDINGS: CT HEAD FINDINGS Brain: Normal anatomic configuration. Parenchymal volume loss is commensurate with the patient's age. Stable mild periventricular white matter changes are present likely reflecting the sequela of small vessel ischemia. No abnormal intra or extra-axial mass lesion or fluid collection. No abnormal mass effect or midline shift. No evidence of acute intracranial hemorrhage or infarct. Ventricular size is normal. Cerebellum unremarkable. Vascular: No asymmetric hyperdense vasculature at the skull base. Skull: Intact Sinuses/Orbits: Paranasal sinuses are clear.  Orbits are unremarkable. Other: Mastoid air cells and middle ear cavities are clear. Mild residual left frontal scalp soft tissue swelling. CT CERVICAL SPINE FINDINGS Alignment: Stable minimal retrolisthesis C2-3 and C3-4 and anterolisthesis C5-6 of approximately 1-2 mm. Skull base and vertebrae: Grade cervical alignment is normal. The atlantodental interval is not widened. No acute fracture of the cervical spine. Vertebral body height is preserved. Soft tissues and spinal canal: No prevertebral fluid or swelling. No visible canal hematoma. Disc levels: There is intervertebral disc space narrowing and endplate remodeling throughout the cervical spine in keeping with changes of advanced degenerative disc disease. Prevertebral soft tissues are thickened on sagittal reformats. Spinal is widely patent. Uncovertebral arthrosis results in severe bilateral neuroforaminal narrowing at C3-4 and on the left C4-5 C5-6. Moderate narrowing left at C6-7. Upper chest: Negative. Other: None IMPRESSION: 1. No acute intracranial abnormality. No calvarial fracture. Mild residual left frontal scalp soft tissue swelling. 2. No acute fracture or listhesis of the cervical spine. 3. 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 Electronically Signed   By: Helyn Numbers M.D.   On: 02/23/2024 00:57   CT Cervical Spine Wo Contrast Result Date: 02/23/2024 CLINICAL DATA:  Head trauma, minor (Age >= 65y); Neck trauma (Age >= 65y) EXAM: CT HEAD WITHOUT CONTRAST CT CERVICAL SPINE WITHOUT CONTRAST TECHNIQUE: Multidetector CT imaging of the head and cervical spine was performed following the standard protocol without intravenous contrast. Multiplanar CT image reconstructions of the cervical spine were also generated. RADIATION DOSE REDUCTION: This exam was performed according to the departmental dose-optimization program which includes automated exposure control,  adjustment of the mA and/or kV according to patient size and/or use of iterative reconstruction technique. COMPARISON:  02/02/2024 FINDINGS: CT HEAD FINDINGS Brain: Normal anatomic configuration. Parenchymal volume loss is commensurate with the patient's age. Stable mild periventricular white matter changes are present likely reflecting the sequela of small vessel ischemia. No abnormal intra or extra-axial mass lesion or fluid collection. No abnormal mass effect or midline shift. No evidence of acute intracranial hemorrhage or infarct. Ventricular size is normal. Cerebellum unremarkable. Vascular: No asymmetric hyperdense vasculature at the skull base. Skull: Intact Sinuses/Orbits: Paranasal sinuses are clear. Orbits are unremarkable. Other: Mastoid air cells and middle ear cavities are clear. Mild residual left frontal scalp soft tissue swelling. CT CERVICAL SPINE FINDINGS Alignment: Stable minimal retrolisthesis C2-3 and C3-4 and anterolisthesis C5-6 of approximately 1-2 mm. Skull base and vertebrae: Grade cervical alignment is normal. The atlantodental interval is not widened. No acute fracture of the cervical spine. Vertebral body height is preserved. Soft tissues and spinal canal: No prevertebral fluid or swelling. No visible canal hematoma. Disc levels: There is intervertebral disc space narrowing and endplate remodeling throughout the cervical spine in keeping with changes of advanced degenerative disc disease. Prevertebral soft tissues are thickened on sagittal reformats. Spinal is widely patent. Uncovertebral arthrosis results in severe bilateral neuroforaminal narrowing at C3-4 and on the left C4-5 C5-6. Moderate narrowing left at C6-7. Upper chest: Negative. Other: None IMPRESSION: 1. No acute intracranial abnormality. No calvarial fracture. Mild residual left frontal scalp soft tissue swelling. 2. No acute fracture or listhesis of the cervical spine. 3. Advanced degenerative disc and degenerative joint  disease resulting in multilevel neuroforaminal narrowing as described above. Electronically Signed   By: Helyn Numbers M.D.   On: 02/23/2024 00:57   DG Foot Complete Right Result Date: 02/22/2024 CLINICAL DATA:  Injury. Demineralization. Dementia, altered mental status, nonverbal patient. EXAM: RIGHT FOOT COMPLETE - 3+ VIEW COMPARISON:  None Available. FINDINGS: No acute fracture or dislocation. The phalanges are not well evaluated due to plantar flexion. No radiographic evidence of osteomyelitis. Degenerative changes about the midfoot and IP joints. Calcaneal spur. Soft tissues are radiographically unremarkable. IMPRESSION: Negative. Electronically Signed   By: Minerva Fester M.D.   On: 02/22/2024 23:25   DG Chest Portable 1 View Result Date: 02/22/2024 CLINICAL DATA:  Altered level of consciousness, dementia EXAM: PORTABLE CHEST 1 VIEW COMPARISON:  02/02/2024 FINDINGS: Single frontal view of the chest demonstrates a stable cardiac silhouette. No acute airspace disease, effusion, or pneumothorax. Stable right upper lobe calcified granuloma. No acute bony abnormalities. Stable calcified left breast implant. IMPRESSION: 1. Stable exam, no acute process. Electronically Signed   By: Sharlet Salina M.D.   On: 02/22/2024 23:22        Scheduled Meds:  aspirin EC  325 mg Oral Daily   atorvastatin  20 mg Oral QODAY   enoxaparin (LOVENOX)  injection  40 mg Subcutaneous Q24H   FLUoxetine  20 mg Oral Daily   levothyroxine  88 mcg Oral QAC breakfast   Continuous Infusions:   LOS: 1 day    Time spent: 45 minutes spent on chart review, discussion with nursing staff, consultants, updating family and interview/physical exam; more than 50% of that time was spent in counseling and/or coordination of care.    Joseph Art, DO [facility] Available via Epic secure chat 7am-7pm After these hours, please refer to coverage provider listed on [email or web address] 02/24/2024, 8:44 AM

## 2024-02-24 NOTE — Progress Notes (Signed)
 PT  Note  Patient Details Name: Brooke Owens MRN: [id number] DOB: 1946/07/07   Cancelled Treatment:    Reason Eval/Treat Not Completed: PT screened, no needs identified, will sign off. Per Palliative Consult plan is for return to [facility] with hospice, GOC-comfort.  Delice Bison, PT  Acute Rehab Dept [facility]) [phone]  02/24/2024    Drucilla Chalet 02/24/2024, 12:16 PM

## 2024-02-24 NOTE — Discharge Summary (Addendum)
 Physician Discharge Summary  Brooke Owens ZOX:[id number] DOB: May 09, 1946 DOA: 02/22/2024  PCP: Christene Lye, FNP  Admit date: 02/22/2024 Discharge date: 02/25/2024  Admitted From: memory care   Discharge disposition: memory care with hospice   Recommendations for Outpatient Follow-Up:   Facility with hospice   Discharge Diagnosis:   Principal Problem:   Acute encephalopathy    Discharge Condition: stable  Diet recommendation: Low sodium, heart health    Code status: dnr   History of Present Illness:    Brooke Owens is a 78 y.o. female with medical history significant for advanced dementia who is a resident of memory care facility, recently treated as an outpatient for E. coli ESBL UTI now being admitted to the hospital with complaint of being less responsive than usual.  Reported fevers, falls, etc.  She was seen with similar complaints in the ER on 3/11, on that day blood culture obtained later grew ESBL E. coli UTI, Macrobid was called in to her facility.  Unable to confirm whether she truly took this medication or not.  Seems that presumably she was doing decently well up until yesterday, when she was brought to the ER due to complaints of reduced responsiveness.  At baseline the patient is nonverbal.  On my discussion with the patient, she is interactive, answered 1 or 2 questions, but as far as I can tell she is disoriented.    Hospital Course by Problem:   Weakness/reduced responsiveness-unclear etiology, I see no evidence of acute infection, she has no fever,  and her urinalysis is not impressive.  Urine was a very dirty sample.  She was presumably treated appropriately for her prior UTI the culture for which was obtained on 3/11 as culture with multiple species.  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 -Discontinue further antibiotics -Repleted potassium --TOC for back to facility ([facility])    Hypokalemia- Continue to replete-- due to hydrochlorothiazide-- would not place on again   Advanced dementia -patient is minimally verbal and demented at baseline-- since hip fracture   Hypothyroidism -continue Synthroid -TSH normal   Hyperlipidemia -Lipitor        family requested palliative care consultation which is appropriate with her advanced dementia and decline since hip fracture.   I recommended to family for her to return to her memory care and transition to palliative care/hospice and not to manage what they can there but not to return to hospital.      Medical Consultants:   Palliative care   Discharge Exam:   Vitals:   02/24/24 0505 02/24/24 1024  BP: 112/63 122/66  Pulse: 74 75  Resp: 16 16  Temp: 98.1 F (36.7 C) 98 F (36.7 C)  SpO2: 99% 100%   Vitals:   02/23/24 1011 02/23/24 2155 02/24/24 0505 02/24/24 1024  BP: (!) 103/47 132/75 112/63 122/66  Pulse: 66 70 74 75  Resp: 18 16 16 16   Temp: 98.2 F (36.8 C) 97.8 F (36.6 C) 98.1 F (36.7 C) 98 F (36.7 C)  TempSrc: Oral Oral Oral   SpO2: 95% 98% 99% 100%  Weight:      Height:        General exam: Appears calm and comfortable.     The results of significant diagnostics from this hospitalization (including imaging, microbiology, ancillary and laboratory) are listed below for reference.     Procedures and Diagnostic Studies:   CT Head Wo Contrast Result Date:  02/23/2024 CLINICAL DATA:  Head trauma, minor (Age >= 65y); Neck trauma (Age >= 65y) EXAM: CT HEAD WITHOUT CONTRAST CT CERVICAL SPINE WITHOUT CONTRAST TECHNIQUE: Multidetector CT imaging of the head and cervical spine was performed following the standard protocol without intravenous contrast. Multiplanar CT image reconstructions of the cervical spine were also generated. RADIATION DOSE REDUCTION: This exam was performed according to the departmental dose-optimization program which includes automated exposure control, adjustment of the  mA and/or kV according to patient size and/or use of iterative reconstruction technique. COMPARISON:  02/02/2024 FINDINGS: CT HEAD FINDINGS Brain: Normal anatomic configuration. Parenchymal volume loss is commensurate with the patient's age. Stable mild periventricular white matter changes are present likely reflecting the sequela of small vessel ischemia. No abnormal intra or extra-axial mass lesion or fluid collection. No abnormal mass effect or midline shift. No evidence of acute intracranial hemorrhage or infarct. Ventricular size is normal. Cerebellum unremarkable. Vascular: No asymmetric hyperdense vasculature at the skull base. Skull: Intact Sinuses/Orbits: Paranasal sinuses are clear. Orbits are unremarkable. Other: Mastoid air cells and middle ear cavities are clear. Mild residual left frontal scalp soft tissue swelling. CT CERVICAL SPINE FINDINGS Alignment: Stable minimal retrolisthesis C2-3 and C3-4 and anterolisthesis C5-6 of approximately 1-2 mm. Skull base and vertebrae: Grade cervical alignment is normal. The atlantodental interval is not widened. No acute fracture of the cervical spine. Vertebral body height is preserved. Soft tissues and spinal canal: No prevertebral fluid or swelling. No visible canal hematoma. Disc levels: There is intervertebral disc space narrowing and endplate remodeling throughout the cervical spine in keeping with changes of advanced degenerative disc disease. Prevertebral soft tissues are thickened on sagittal reformats. Spinal is widely patent. Uncovertebral arthrosis results in severe bilateral neuroforaminal narrowing at C3-4 and on the left C4-5 C5-6. Moderate narrowing left at C6-7. Upper chest: Negative. Other: None IMPRESSION: 1. No acute intracranial abnormality. No calvarial fracture. Mild residual left frontal scalp soft tissue swelling. 2. No acute fracture or listhesis of the cervical spine. 3. 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 Electronically Signed   By: Helyn Numbers M.D.   On: 02/23/2024 00:57   CT Cervical Spine Wo Contrast Result Date: 02/23/2024 CLINICAL DATA:  Head trauma, minor (Age >= 65y); Neck trauma (Age >= 65y) EXAM: CT HEAD WITHOUT CONTRAST CT CERVICAL SPINE WITHOUT CONTRAST TECHNIQUE: Multidetector CT imaging of the head and cervical spine was performed following the standard protocol without intravenous contrast. Multiplanar CT image reconstructions of the cervical spine were also generated. RADIATION DOSE REDUCTION: This exam was performed according to the departmental dose-optimization program which includes automated exposure control, adjustment of the mA and/or kV according to patient size and/or use of iterative reconstruction technique. COMPARISON:  02/02/2024 FINDINGS: CT HEAD FINDINGS Brain: Normal anatomic configuration. Parenchymal volume loss is commensurate with the patient's age. Stable mild periventricular white matter changes are present likely reflecting the sequela of small vessel ischemia. No abnormal intra or extra-axial mass lesion or fluid collection. No abnormal mass effect or midline shift. No evidence of acute intracranial hemorrhage or infarct. Ventricular size is normal. Cerebellum unremarkable. Vascular: No asymmetric hyperdense vasculature at the skull base. Skull: Intact Sinuses/Orbits: Paranasal sinuses are clear. Orbits are unremarkable. Other: Mastoid air cells and middle ear cavities are clear. Mild residual left frontal scalp soft tissue swelling. CT CERVICAL SPINE FINDINGS Alignment: Stable minimal retrolisthesis C2-3 and C3-4 and anterolisthesis C5-6 of approximately 1-2 mm. Skull base and vertebrae: Grade cervical alignment  is normal. The atlantodental interval is not widened. No acute fracture of the cervical spine. Vertebral body height is preserved. Soft tissues and spinal canal: No prevertebral fluid or swelling. No  visible canal hematoma. Disc levels: There is intervertebral disc space narrowing and endplate remodeling throughout the cervical spine in keeping with changes of advanced degenerative disc disease. Prevertebral soft tissues are thickened on sagittal reformats. Spinal is widely patent. Uncovertebral arthrosis results in severe bilateral neuroforaminal narrowing at C3-4 and on the left C4-5 C5-6. Moderate narrowing left at C6-7. Upper chest: Negative. Other: None IMPRESSION: 1. No acute intracranial abnormality. No calvarial fracture. Mild residual left frontal scalp soft tissue swelling. 2. No acute fracture or listhesis of the cervical spine. 3. 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 Electronically Signed   By: Helyn Numbers M.D.   On: 02/23/2024 00:57   DG Foot Complete Right Result Date: 02/22/2024 CLINICAL DATA:  Injury. Demineralization. Dementia, altered mental status, nonverbal patient. EXAM: RIGHT FOOT COMPLETE - 3+ VIEW COMPARISON:  None Available. FINDINGS: No acute fracture or dislocation. The phalanges are not well evaluated due to plantar flexion. No radiographic evidence of osteomyelitis. Degenerative changes about the midfoot and IP joints. Calcaneal spur. Soft tissues are radiographically unremarkable. IMPRESSION: Negative. Electronically Signed   By: Minerva Fester M.D.   On: 02/22/2024 23:25   DG Chest Portable 1 View Result Date: 02/22/2024 CLINICAL DATA:  Altered level of consciousness, dementia EXAM: PORTABLE CHEST 1 VIEW COMPARISON:  02/02/2024 FINDINGS: Single frontal view of the chest demonstrates a stable cardiac silhouette. No acute airspace disease, effusion, or pneumothorax. Stable right upper lobe calcified granuloma. No acute bony abnormalities. Stable calcified left breast implant. IMPRESSION: 1. Stable exam, no acute process. Electronically Signed   By: Sharlet Salina M.D.   On: 02/22/2024  23:22     Labs:   Basic Metabolic Panel: Recent Labs  Lab 02/22/24 2238 02/23/24 0748 02/23/24 1447 02/24/24 0335 02/24/24 0345  NA 139 137 136  --  137  K 2.5* 2.8* 3.3*  --  3.2*  CL 102 103 104  --  103  CO2 31 24 25   --  25  GLUCOSE 88 82 121*  --  79  BUN 30* 21 20  --  14  CREATININE 0.71 0.52 0.65  --  0.44  CALCIUM 8.7* 8.4* 8.4*  --  8.7*  MG 2.1  --   --  1.7  --    GFR Estimated Creatinine Clearance: 58.3 mL/min (by C-G formula based on SCr of 0.44 mg/dL). Liver Function Tests: Recent Labs  Lab 02/22/24 2238  AST 37  ALT 20  ALKPHOS 65  BILITOT 1.3*  PROT 5.8*  ALBUMIN 3.1*   No results for input(s): "LIPASE", "AMYLASE" in the last 168 hours. No results for input(s): "AMMONIA" in the last 168 hours. Coagulation profile No results for input(s): "INR", "PROTIME" in the last 168 hours.  CBC: Recent Labs  Lab 02/22/24 2238 02/24/24 0345  WBC 6.6 10.8*  HGB 11.2* 11.9*  HCT 34.8* 37.5  MCV 89.9 90.8  PLT 252 260   Cardiac Enzymes: No results for input(s): "CKTOTAL", "CKMB", "CKMBINDEX", "TROPONINI" in the last 168 hours. BNP: Invalid input(s): "POCBNP" CBG: Recent Labs  Lab 02/22/24 2255 02/24/24 0831  GLUCAP 89 89   D-Dimer No results for input(s): "DDIMER" in the last 72 hours. Hgb A1c No results for input(s): "HGBA1C" in the last 72 hours. Lipid Profile No  results for input(s): "CHOL", "HDL", "LDLCALC", "TRIG", "CHOLHDL", "LDLDIRECT" in the last 72 hours. Thyroid function studies Recent Labs    02/23/24 0927  TSH 2.228   Anemia work up No results for input(s): "VITAMINB12", "FOLATE", "FERRITIN", "TIBC", "IRON", "RETICCTPCT" in the last 72 hours. Microbiology Recent Results (from the past 240 hours)  Resp panel by RT-PCR (RSV, Flu A&B, Covid) Anterior Nasal Swab     Status: None   Collection Time: 02/23/24 12:36 AM   Specimen: Anterior Nasal Swab  Result Value Ref Range Status   SARS Coronavirus 2 by RT PCR NEGATIVE NEGATIVE  Final    Comment: (NOTE) SARS-CoV-2 target nucleic acids are NOT DETECTED.  The SARS-CoV-2 RNA is generally detectable in upper respiratory specimens during the acute phase of infection. The lowest concentration of SARS-CoV-2 viral copies this assay can detect is 138 copies/mL. A negative result does not preclude SARS-Cov-2 infection and should not be used as the sole basis for treatment or other patient management decisions. A negative result may occur with  improper specimen collection/handling, submission of specimen other than nasopharyngeal swab, presence of viral mutation(s) within the areas targeted by this assay, and inadequate number of viral copies(<138 copies/mL). A negative result must be combined with clinical observations, patient history, and epidemiological information. The expected result is Negative.  Fact Sheet for Patients:  BloggerCourse.com  Fact Sheet for Healthcare Providers:  SeriousBroker.it  This test is no t yet approved or cleared by the [place] FDA and  has been authorized for detection and/or diagnosis of SARS-CoV-2 by FDA under an Emergency Use Authorization (EUA). This EUA will remain  in effect (meaning this test can be used) for the duration of the COVID-19 declaration under Section 564(b)(1) of the Act, 21 U.S.C.section 360bbb-3(b)(1), unless the authorization is terminated  or revoked sooner.       Influenza A by PCR NEGATIVE NEGATIVE Final   Influenza B by PCR NEGATIVE NEGATIVE Final    Comment: (NOTE) The Xpert Xpress SARS-CoV-2/FLU/RSV plus assay is intended as an aid in the diagnosis of influenza from Nasopharyngeal swab specimens and should not be used as a sole basis for treatment. Nasal washings and aspirates are unacceptable for Xpert Xpress SARS-CoV-2/FLU/RSV testing.  Fact Sheet for Patients: BloggerCourse.com  Fact Sheet for Healthcare  Providers: SeriousBroker.it  This test is not yet approved or cleared by the [place] FDA and has been authorized for detection and/or diagnosis of SARS-CoV-2 by FDA under an Emergency Use Authorization (EUA). This EUA will remain in effect (meaning this test can be used) for the duration of the COVID-19 declaration under Section 564(b)(1) of the Act, 21 U.S.C. section 360bbb-3(b)(1), unless the authorization is terminated or revoked.     Resp Syncytial Virus by PCR NEGATIVE NEGATIVE Final    Comment: (NOTE) Fact Sheet for Patients: BloggerCourse.com  Fact Sheet for Healthcare Providers: SeriousBroker.it  This test is not yet approved or cleared by the [place] FDA and has been authorized for detection and/or diagnosis of SARS-CoV-2 by FDA under an Emergency Use Authorization (EUA). This EUA will remain in effect (meaning this test can be used) for the duration of the COVID-19 declaration under Section 564(b)(1) of the Act, 21 U.S.C. section 360bbb-3(b)(1), unless the authorization is terminated or revoked.  Performed at [facility], [address], [place], [place] [address]   Urine Culture     Status: Abnormal   Collection Time: 02/23/24  1:45 AM   Specimen: Urine, Catheterized  Result Value  Ref Range Status   Specimen Description   Final    URINE, CATHETERIZED Performed at [facility], [address], [place], [place] [address]    Special Requests   Final    NONE Performed at [facility], [address], [place], [place] [address]    Culture MULTIPLE SPECIES PRESENT, SUGGEST RECOLLECTION (A)  Final   Report Status 02/24/2024 FINAL  Final     Discharge Instructions:   Discharge Instructions     Diet general   Complete by: As directed    Increase activity slowly   Complete by: As directed       Allergies as of  02/24/2024       Reactions   Demerol [meperidine Hcl] Nausea And Vomiting        Medication List     STOP taking these medications    SIMPLY SALINE WOUND WASH SPRAY EX       TAKE these medications    acetaminophen 650 MG CR tablet Commonly known as: TYLENOL Take 650 mg by mouth every 6 (six) hours as needed for pain or fever (headache).   aspirin EC 325 MG tablet Take 325 mg by mouth daily.   atorvastatin 20 MG tablet Commonly known as: LIPITOR Take 1 tablet (20 mg total) by mouth every other day.   Ensure Take 1 Can by mouth 6 (six) times daily.   FLUoxetine 20 MG capsule Commonly known as: PROZAC Take 20 mg by mouth daily.   levothyroxine 88 MCG tablet Commonly known as: SYNTHROID Take 88 mcg by mouth daily before breakfast.   LORazepam 0.5 MG tablet Commonly known as: ATIVAN Take 0.5 mg by mouth every 6 (six) hours as needed for anxiety.          Time coordinating discharge: 45 min  Signed:  Joseph Art DO  [facility] 02/24/2024, 11:55 AM

## 2024-02-24 NOTE — TOC Initial Note (Addendum)
 Transition of Care [facility]) - Initial/Assessment Note    Patient Details  Name: Brooke Owens MRN: [id number] Date of Birth: 1946-04-23  [facility]) CM/SW Contact:    Howell Rucks, RN Phone Number: 02/24/2024, 12:06 PM  Clinical Narrative:  Patient resident at [facility] , NCM called to facility, sw Joni Reining , confirmed patient is able to return at discharge. Teams chat from bedside nurse-palliative care to meet with family today -- plan most likely will be back to facility with hospice/palliative care. TOC will continue to follow.    -1:42pm TOC consult for Home Hospice when patient returns to [facility] Unit, states dtr prefers Hospice facility uses. NCM call to [facility], sw Shamika, DON, introduced self and role of TOC.NCM, confirmed pt able to return to Memory Care Unit with Hospice, reports the use [facility] or [facility]. NCM called to pt's dtr [name]), introduced self and role of TOC/NCM,  confimed request for Hospice for pt, informed of Hospice providers facility uses, prefers [facility].   -3:54pm [facility] accepted for home hospice, per Efraim Kaufmann, will have nurse available to admit tomorrow, team notified.          Patient Goals and CMS Choice            Expected Discharge Plan and Services         Expected Discharge Date: 02/24/24                                    Prior Living Arrangements/Services                       Activities of Daily Living   ADL Screening (condition at time of admission) Independently performs ADLs?: Yes (appropriate for developmental age) Is the patient deaf or have difficulty hearing?: No Does the patient have difficulty seeing, even when wearing glasses/contacts?: No Does the patient have difficulty concentrating, remembering, or making decisions?: No  Permission Sought/Granted                  Emotional Assessment              Admission  diagnosis:  Acute encephalopathy [G93.40] Altered mental status, unspecified altered mental status type [R41.82] Patient Active Problem List   Diagnosis Date Noted   Acute encephalopathy 02/23/2024   Closed left hip fracture (HCC) 04/28/2023   Hypothyroidism 04/21/2019   Hyperlipidemia 04/21/2019   Insomnia 04/21/2019   Depression, 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 [place] [address] Phone: [phone] Fax: [phone]  [facility] [address] [place] [address] Phone: [phone] Fax: [phone]     Social Drivers of Health (SDOH) Social History: SDOH Screenings   Food Insecurity: Patient Unable To Answer (02/23/2024)  Housing: Patient Unable To Answer (02/23/2024)  Transportation Needs: Patient Unable To Answer (02/23/2024)  Utilities: Patient Unable To Answer (02/23/2024)  Depression (PHQ2-9): Low Risk  (04/21/2019)  Social Connections: Patient Unable To Answer (02/23/2024)  Tobacco Use: Low Risk  (02/22/2024)   SDOH Interventions:     Readmission Risk Interventions     No data to display

## 2024-02-24 NOTE — Progress Notes (Signed)
 [facility] Liaison Note  Received request from [facility] for hospice services at [facility] after discharge. Spoke with patient's daughter, Cala Bradford to initiate education related to hospice philosophy, services and team approach to care. Daughter verbalized understanding of information given. Per discussion, the plan is for discharge today.   DME needs discussed. Patient has the following equipment in the home: wheelchair Family requests the following equipment for delivery: admissions nurse to assess needs  Please send signed and completed DNR home with patient/family. Please provide prescriptions at discharge as needed to ensure ongoing symptom management.  [facility] information and contact numbers given to [facility]. Please call with any concerns.  Thank you for the opportunity to participate in this patient's care.   Henderson Newcomer, LPN [facility] Liaison [phone]

## 2024-02-24 NOTE — Consult Note (Signed)
 Consultation Note Date: 02/24/2024   Patient Name: Brooke Owens  DOB: Sep 14, 1946  MRN: [id number]  Age / Sex: 78 y.o., female  PCP: Christene Lye, FNP Referring Physician: Joseph Art, DO  Reason for Consultation:  goals of care  HPI/Patient Profile: 78 y.o. female  with past medical history of advanced dementia, ESBL UTI, admitted on 02/22/2024 with mental status change. Workup revealed severe hypokalemia at 2.5. No infectious findings. No other reversible findings. Palliative medicine consulted per family request.   Primary Decision Maker NEXT OF KIN - patient's daughter- [name]  Discussion: Chart reviewed including labs, progress notes, imaging from this and previous encounters.  Discussed case with Dr. Benjamine Mola.  Evaluated patient- mumbles mostly unintelligibly- one intelligible word- "hello". Appears frail, cachetic.  Bilateral upper extremity contractions.  Spoke on the phone with daughterCala Bradford. Patient is a widow. Living at [facility].  Patient has had sharp decline since her hip surgery last year. She is now having frequent falls- even falling out of her wheelchair. PO intake is minimal. She has had recurrent hospitalization/ED visits for falls.  Primary goal of care for patient is for comfort in place at her facility.  We discussed hospice services and philosophy of care. Cala Bradford is agreeable to referral for hospice services and focusing on patient's quality of life over quantity. She would not want patient to return to hospital in the future.   SUMMARY OF RECOMMENDATIONS -Advanced dementia- Stage 7c -TOC order placed for referral to hospice -D/C back to [facility] when hospice has been arranged -On discharge, would recommend scripts for: - Morphine Concentrate 10mg /0.69ml: 5mg  (0.64ml) sublingual every 1 hour as needed for pain or shortness of breath: Disp 30ml - Lorazepam  2mg /ml concentrated solution: 1mg  (0.51ml) sublingual every 4 hours as needed for anxiety: Disp 30ml - Haldol 2mg /ml solution: 0.5mg  (0.74ml) sublingual every 4 hours as needed for agitation or nausea: Disp 30ml      Code Status/Advance Care Planning:   Code Status: Do not attempt resuscitation (DNR) PRE-ARREST INTERVENTIONS DESIRED    Prognosis:   < 6 months  Discharge Planning: Home with Hospice  Primary Diagnoses: Present on Admission:  Acute encephalopathy   Review of Systems  Unable to perform ROS: Mental status change    Physical Exam Vitals and nursing note reviewed.  Constitutional:      Comments: Frail, cachetic  Cardiovascular:     Rate and Rhythm: Normal rate.  Pulmonary:     Effort: Pulmonary effort is normal.  Musculoskeletal:     Comments: Bilateral upper extremity contractures  Skin:    Comments: Scattered bruising  Neurological:     Comments: Unintelligible speech  Psychiatric:     Comments: pleasant     Vital Signs: BP 122/66 (BP Location: Right Leg)   Pulse 75   Temp 98 F (36.7 C)   Resp 16   Ht 5\' 4"  (1.626 m)   Wt 77.1 kg   SpO2 100%   BMI 29.18 kg/m  Pain Scale: 0-10   Pain Score:  0-No pain   SpO2: SpO2: 100 % O2 Device:SpO2: 100 % O2 Flow Rate: .   IO: Intake/output summary:  Intake/Output Summary (Last 24 hours) at 02/24/2024 1149 Last data filed at 02/24/2024 8657 Gross per 24 hour  Intake 900 ml  Output 700 ml  Net 200 ml    LBM: Last BM Date : 02/23/24 Baseline Weight: Weight: 77.1 kg Most recent weight: Weight: 77.1 kg       Thank you for this consult. Palliative medicine will continue to follow and assist as needed.   Signed by: Ocie Bob, AGNP-C Palliative Medicine  Time includes:   Preparing to see the patient (e.g., review of tests) Obtaining and/or reviewing separately obtained history Performing a medically necessary appropriate examination and/or evaluation Counseling and educating the  patient/family/caregiver Ordering medications, tests, or procedures Referring and communicating with other health care professionals (when not reported separately) Documenting clinical information in the electronic or other health record Independently interpreting results (not reported separately) and communicating results to the patient/family/caregiver Care coordination (not reported separately) Clinical documentation   Please contact Palliative Medicine Team phone at [phone] for questions and concerns.  For individual provider: See Loretha Stapler

## 2024-02-25 DIAGNOSIS — Z515 Encounter for palliative care: Secondary | ICD-10-CM | POA: Diagnosis not present

## 2024-02-25 DIAGNOSIS — G9340 Encephalopathy, unspecified: Secondary | ICD-10-CM | POA: Diagnosis not present

## 2024-02-25 DIAGNOSIS — F03C0 Unspecified dementia, severe, without behavioral disturbance, psychotic disturbance, mood disturbance, and anxiety: Secondary | ICD-10-CM | POA: Diagnosis not present

## 2024-02-25 NOTE — TOC Transition Note (Signed)
 Transition of Care [facility]) - Discharge Note   Patient Details  Name: Brooke Owens MRN: [id number] Date of Birth: 21-Dec-1945  [facility]) CM/SW Contact:  Howell Rucks, RN Phone Number: 02/25/2024, 3:28 PM   Clinical Narrative:   DC to [facility] with Hospice through [place], [place] [address], Call report [phone], ask for [employer] [facility] for transport. No further TOC needs identified.     Final next level of care: Memory Care Barriers to Discharge: Barriers Resolved   Patient Goals and CMS Choice Patient states their goals for this hospitalization and ongoing recovery are:: Return to [facility] with Hospice CMS Medicare.gov Compare Post Acute Care list provided to:: Patient Represenative (must comment) [name] (Daughter)  [phone] (Mobile)) Choice offered to / list presented to : Adult Children [place] ownership interest in [facility].provided to:: Adult Children    Discharge Placement                       Discharge Plan and Services Additional resources added to the After Visit Summary for                                       Social Drivers of Health (SDOH) Interventions SDOH Screenings   Food Insecurity: Patient Unable To Answer (02/23/2024)  Housing: Patient Unable To Answer (02/23/2024)  Transportation Needs: Patient Unable To Answer (02/23/2024)  Utilities: Patient Unable To Answer (02/23/2024)  Depression (PHQ2-9): Low Risk  (04/21/2019)  Social Connections: Patient Unable To Answer (02/23/2024)  Tobacco Use: Low Risk  (02/22/2024)     Readmission Risk Interventions    02/25/2024    3:27 PM  Readmission Risk Prevention Plan  Transportation Screening Complete  PCP or Specialist Appt within 5-7 Days Complete  Home Care Screening Complete  Medication Review (RN CM) Complete

## 2024-02-25 NOTE — Discharge Planning (Signed)
 Gave report to Candace at [facility]. Discharge paperwork printed and placed in patient packet for transfer.

## 2024-02-25 NOTE — Progress Notes (Addendum)
 Patient d/c'd back to facility with hospice on 4/2.  D/C held due to hospice RN staffing.  Appears patient to be d/c'd today Please see d/c summary from 4/2.  Not requiring any medications for symptoms currently. Marlin Canary DO

## 2024-02-25 NOTE — Progress Notes (Signed)
 Daily Progress Note   Patient Name: Brooke Owens       Date: 02/25/2024 DOB: 1946/09/13  Age: 78 y.o. MRN#: [id number] Attending Physician: Joseph Art, DO Primary Care Physician: Christene Lye, FNP Admit Date: 02/22/2024  Reason for Consultation/Follow-up: Establishing goals of care  Patient Profile/HPI:  78 y.o. female  with past medical history of advanced dementia, ESBL UTI, admitted on 02/22/2024 with mental status change. Workup revealed severe hypokalemia at 2.5. No infectious findings. No other reversible findings. Palliative medicine consulted per family request.   Subjective: Chart reviewed including labs, progress notes, imaging from this and previous encounters.  Patient sleeping. Appears comfortable. Noted MD note- pt to d/c today to facility.   Review of Systems  Unable to perform ROS: Mental status change     Physical Exam Vitals and nursing note reviewed.  Constitutional:      Comments: Frail, cachetic  Cardiovascular:     Rate and Rhythm: Normal rate.  Psychiatric:     Comments: sleeping             Vital Signs: BP (!) 120/53 (BP Location: Right Leg)   Pulse 76   Temp 97.8 F (36.6 C)   Resp 14   Ht 5\' 4"  (1.626 m)   Wt 77.1 kg   SpO2 100%   BMI 29.18 kg/m  SpO2: SpO2: 100 % O2 Device: O2 Device: Room Air O2 Flow Rate:    Intake/output summary:  Intake/Output Summary (Last 24 hours) at 02/25/2024 1031 Last data filed at 02/25/2024 0900 Gross per 24 hour  Intake 660.11 ml  Output 300 ml  Net 360.11 ml   LBM: Last BM Date : 02/24/24 Baseline Weight: Weight: 77.1 kg Most recent weight: Weight: 77.1 kg       Palliative Assessment/Data: PPS: 20%      Patient Active Problem List   Diagnosis Date Noted   Acute encephalopathy 02/23/2024    Closed left hip fracture (HCC) 04/28/2023   Hypothyroidism 04/21/2019   Hyperlipidemia 04/21/2019   Insomnia 04/21/2019   Depression, recurrent (HCC) 04/21/2019    Palliative Care Assessment & Plan    Assessment/Recommendations/Plan  Continue comfort measures D/C to facility with hospice   Code Status:   Code Status: Do not attempt resuscitation (DNR) PRE-ARREST INTERVENTIONS DESIRED   Prognosis:  < 6 months  Discharge Planning: Home with Hospice    Thank you for allowing the Palliative Medicine Team to assist in the care of this patient.  Total time:  Prolonged billing:  Time includes:   Preparing to see the patient (e.g., review of tests) Obtaining and/or reviewing separately obtained history Performing a medically necessary appropriate examination and/or evaluation Counseling and educating the patient/family/caregiver Ordering medications, tests, or procedures Referring and communicating with other health care professionals (when not reported separately) Documenting clinical information in the electronic or other health record Independently interpreting results (not reported separately) and communicating results to the patient/family/caregiver Care coordination (not reported separately) Clinical documentation  Ocie Bob, AGNP-C Palliative Medicine   Please contact Palliative Medicine Team phone at [phone] for questions and concerns.
